# Patient Record
Sex: Female | Born: 1990 | Race: Black or African American | Hispanic: No | Marital: Single | Smoking: Former smoker
Health system: Southern US, Community
[De-identification: ages and names within clinical notes are randomized; demographics above are authoritative.]

## PROBLEM LIST (undated history)

## (undated) DIAGNOSIS — R519 Headache, unspecified: Secondary | ICD-10-CM

## (undated) HISTORY — DX: Headache, unspecified: R51.9

---

## 2015-02-13 ENCOUNTER — Emergency Department (HOSPITAL_COMMUNITY)
Admission: EM | Admit: 2015-02-13 | Discharge: 2015-02-14 | Disposition: A | Discharge status: Home / Self Care | Payer: PRIVATE HEALTH INSURANCE | Attending: Emergency Medicine | Admitting: Emergency Medicine

## 2015-02-13 ENCOUNTER — Encounter (HOSPITAL_COMMUNITY): Payer: Self-pay | Admitting: *Deleted

## 2015-02-13 DIAGNOSIS — R109 Unspecified abdominal pain: Secondary | ICD-10-CM | POA: Diagnosis present

## 2015-02-13 DIAGNOSIS — Z3202 Encounter for pregnancy test, result negative: Secondary | ICD-10-CM | POA: Diagnosis not present

## 2015-02-13 DIAGNOSIS — Z720 Tobacco use: Secondary | ICD-10-CM | POA: Insufficient documentation

## 2015-02-13 NOTE — ED Notes (Signed)
Pt reports bila flank  Pain today.  Pt also reports bila hands and feet swelling.

## 2015-02-14 ENCOUNTER — Emergency Department (HOSPITAL_COMMUNITY): Payer: PRIVATE HEALTH INSURANCE

## 2015-02-14 LAB — COMPREHENSIVE METABOLIC PANEL
ALK PHOS: 52 U/L (ref 38–126)
ALT: 11 U/L — AB (ref 14–54)
AST: 16 U/L (ref 15–41)
Albumin: 4.1 g/dL (ref 3.5–5.0)
Anion gap: 9 (ref 5–15)
BUN: 11 mg/dL (ref 6–20)
CALCIUM: 8.8 mg/dL — AB (ref 8.9–10.3)
CO2: 24 mmol/L (ref 22–32)
Chloride: 105 mmol/L (ref 101–111)
Creatinine, Ser: 0.71 mg/dL (ref 0.44–1.00)
GFR calc non Af Amer: >60 mL/min (ref >60)
Glucose, Bld: 96 mg/dL (ref 65–99)
POTASSIUM: 3.3 mmol/L — AB (ref 3.5–5.1)
SODIUM: 138 mmol/L (ref 135–145)
TOTAL PROTEIN: 7.5 g/dL (ref 6.5–8.1)
Total Bilirubin: 0.2 mg/dL — ABNORMAL LOW (ref 0.3–1.2)

## 2015-02-14 LAB — CBC WITH DIFFERENTIAL/PLATELET
Basophils Absolute: 0 10*3/uL (ref 0.0–0.1)
Basophils Relative: 0 % (ref 0–1)
EOS ABS: 0.3 10*3/uL (ref 0.0–0.7)
EOS PCT: 2 % (ref 0–5)
HCT: 36.6 % (ref 36.0–46.0)
Hemoglobin: 12.3 g/dL (ref 12.0–15.0)
LYMPHS ABS: 3.6 10*3/uL (ref 0.7–4.0)
LYMPHS PCT: 24 % (ref 12–46)
MCH: 29.5 pg (ref 26.0–34.0)
MCHC: 33.6 g/dL (ref 30.0–36.0)
MCV: 87.8 fL (ref 78.0–100.0)
MONOS PCT: 7 % (ref 3–12)
Monocytes Absolute: 1.1 10*3/uL — ABNORMAL HIGH (ref 0.1–1.0)
Neutro Abs: 9.8 10*3/uL — ABNORMAL HIGH (ref 1.7–7.7)
Neutrophils Relative %: 67 % (ref 43–77)
PLATELETS: 262 10*3/uL (ref 150–400)
RBC: 4.17 MIL/uL (ref 3.87–5.11)
RDW: 12.5 % (ref 11.5–15.5)
WBC: 14.8 10*3/uL — ABNORMAL HIGH (ref 4.0–10.5)

## 2015-02-14 LAB — PREGNANCY, URINE: Preg Test, Ur: NEGATIVE

## 2015-02-14 LAB — URINALYSIS, ROUTINE W REFLEX MICROSCOPIC
BILIRUBIN URINE: NEGATIVE
Glucose, UA: NEGATIVE mg/dL
HGB URINE DIPSTICK: NEGATIVE
Ketones, ur: NEGATIVE mg/dL
Leukocytes, UA: NEGATIVE
Nitrite: NEGATIVE
Protein, ur: NEGATIVE mg/dL
SPECIFIC GRAVITY, URINE: 1.008 (ref 1.005–1.030)
Urobilinogen, UA: 0.2 mg/dL (ref 0.0–1.0)
pH: 5.5 (ref 5.0–8.0)

## 2015-02-14 LAB — LIPASE, BLOOD: LIPASE: 18 U/L — AB (ref 22–51)

## 2015-02-14 NOTE — ED Provider Notes (Signed)
CSN: [id number]     Arrival date & time 02/13/15  2136 History   First MD Initiated Contact with Patient 02/14/15 0023     Chief Complaint  Patient presents with  . Flank Pain     (Consider location/radiation/quality/duration/timing/severity/associated sxs/prior Treatment) HPI  Ms. Vondra is a 24yo female with no significant past medical history presenting today with bilateral flank pain. She states she has had left-sided flank pain for many months off and on. Today it has progressed to bilateral flank pain. Her pain is sharp without radiation. She has had decreased urination. She denies history of kidney stones or urinary infections. She denies dysuria or hematuria. She's had no fevers or recent infections. She denies changes in her medications or medical history. Patient has no further complaints.  10 Systems reviewed and are negative for acute change except as noted in the HPI.   History reviewed. No pertinent past medical history. History reviewed. No pertinent past surgical history. No family history on file. History  Substance Use Topics  . Smoking status: Current Every Day Smoker    Types: Cigars  . Smokeless tobacco: Not on file  . Alcohol Use: Yes   OB History    No data available     Review of Systems    Allergies  Review of patient's allergies indicates no known allergies.  Home Medications   Prior to Admission medications   Medication Sig Start Date End Date Taking? Authorizing Provider  ibuprofen (ADVIL,MOTRIN) 200 MG tablet Take 400 mg by mouth every 6 (six) hours as needed for moderate pain.   Yes Historical Provider, MD   BP 125/77 mmHg  Pulse 106  Temp(Src) 98.6 F (37 C) (Oral)  Resp 20  SpO2 100%  LMP 01/16/2015 Physical Exam  Constitutional: She is oriented to person, place, and time. She appears well-developed and well-nourished. No distress.  HENT:  Head: Normocephalic and atraumatic.  Nose: Nose normal.  Mouth/Throat: Oropharynx is clear and  moist. No oropharyngeal exudate.  Eyes: Conjunctivae and EOM are normal. Pupils are equal, round, and reactive to light. No scleral icterus.  Neck: Normal range of motion. Neck supple. No JVD present. No tracheal deviation present. No thyromegaly present.  Cardiovascular: Normal rate, regular rhythm and normal heart sounds.  Exam reveals no gallop and no friction rub.   No murmur heard. Pulmonary/Chest: Effort normal and breath sounds normal. No respiratory distress. She has no wheezes. She exhibits no tenderness.  Abdominal: Soft. Bowel sounds are normal. She exhibits no distension and no mass. There is no tenderness. There is no rebound and no guarding.  Musculoskeletal: Normal range of motion. She exhibits no edema or tenderness.  Lymphadenopathy:    She has no cervical adenopathy.  Neurological: She is alert and oriented to person, place, and time. No cranial nerve deficit. She exhibits normal muscle tone.  Skin: Skin is warm and dry. No rash noted. She is not diaphoretic. No erythema. No pallor.  Nursing note and vitals reviewed.   ED Course  Procedures (including critical care time) Labs Review Labs Reviewed  CBC WITH DIFFERENTIAL/PLATELET - Abnormal; Notable for the following:    WBC 14.8 (*)    Neutro Abs 9.8 (*)    Monocytes Absolute 1.1 (*)    All other components within normal limits  COMPREHENSIVE METABOLIC PANEL - Abnormal; Notable for the following:    Potassium 3.3 (*)    Calcium 8.8 (*)    ALT 11 (*)    Total Bilirubin 0.2 (*)  All other components within normal limits  LIPASE, BLOOD - Abnormal; Notable for the following:    Lipase 18 (*)    All other components within normal limits  URINALYSIS, ROUTINE W REFLEX MICROSCOPIC (NOT AT [facility])  PREGNANCY, URINE    Imaging Review No results found.   EKG Interpretation None      MDM   Final diagnoses:  None    Patient presents to the emergency department with bilateral flank pain. States that urinalysis  are completely unremarkable. Patient is not tender anywhere on exam. It also was ordered to evaluate for possible hydronephrosis or nephrolithiasis.  Renal ultrasound reveals a normal study. I'm unsure what is causing this patient's flank pain. AAA was considered and is highly unlikely given the patient's history.  She is advised to see her primary care physician within 3 days for close follow-up.  Acetaminophen (normal limits, her blood pressure of 91/44 occurred while she was sleeping comfortably, patient is safe for discharge with close follow-up.    Tomasita CrumbleAdeleke Reyden Smith, MD 02/14/15 [phone]

## 2015-02-14 NOTE — Discharge Instructions (Signed)
Flank Pain Victoria Barron, your blood work, urine studies, and ultrasound of your kidneys were all normal today.See a primary care Physician within 3 days for close follow-up. If any symptoms worsen, to the emergency Department immediately. Thank you. Flank pain is pain in your side. The flank is the area of your side between your upper belly (abdomen) and your back. Pain in this area can be caused by many different things. HOME CARE Home care and treatment will depend on the cause of your pain.  Rest as told by your doctor.  Drink enough fluids to keep your pee (urine) clear or pale yellow.  Only take medicine as told by your doctor.  Tell your doctor about any changes in your pain.  Follow up with your doctor. GET HELP RIGHT AWAY IF:   Your pain does not get better with medicine.   You have new symptoms or your symptoms get worse.  Your pain gets worse.   You have belly (abdominal) pain.   You are short of breath.   You always feel sick to your stomach (nauseous).   You keep throwing up (vomiting).   You have puffiness (swelling) in your belly.   You feel light-headed or you pass out (faint).   You have blood in your pee.  You have a fever or lasting symptoms for more than 2-3 days.  You have a fever and your symptoms suddenly get worse. MAKE SURE YOU:   Understand these instructions.  Will watch your condition.  Will get help right away if you are not doing well or get worse. Document Released: 06/03/2008 Document Revised: 01/09/2014 Document Reviewed: 04/08/2012 [facility] Patient Information 2015 [place], [place]. This information is not intended to replace advice given to you by your health care provider. Make sure you discuss any questions you have with your health care provider.

## 2015-03-15 ENCOUNTER — Emergency Department (HOSPITAL_COMMUNITY)
Admission: EM | Admit: 2015-03-15 | Discharge: 2015-03-15 | Disposition: A | Discharge status: Home / Self Care | Payer: PRIVATE HEALTH INSURANCE | Attending: Emergency Medicine | Admitting: Emergency Medicine

## 2015-03-15 ENCOUNTER — Encounter (HOSPITAL_COMMUNITY): Payer: Self-pay

## 2015-03-15 ENCOUNTER — Emergency Department (HOSPITAL_COMMUNITY): Payer: PRIVATE HEALTH INSURANCE

## 2015-03-15 DIAGNOSIS — Y9389 Activity, other specified: Secondary | ICD-10-CM | POA: Insufficient documentation

## 2015-03-15 DIAGNOSIS — R0789 Other chest pain: Secondary | ICD-10-CM

## 2015-03-15 DIAGNOSIS — S199XXA Unspecified injury of neck, initial encounter: Secondary | ICD-10-CM | POA: Diagnosis not present

## 2015-03-15 DIAGNOSIS — Z720 Tobacco use: Secondary | ICD-10-CM | POA: Diagnosis not present

## 2015-03-15 DIAGNOSIS — Y998 Other external cause status: Secondary | ICD-10-CM | POA: Diagnosis not present

## 2015-03-15 DIAGNOSIS — S29001A Unspecified injury of muscle and tendon of front wall of thorax, initial encounter: Secondary | ICD-10-CM | POA: Insufficient documentation

## 2015-03-15 DIAGNOSIS — Y92410 Unspecified street and highway as the place of occurrence of the external cause: Secondary | ICD-10-CM | POA: Diagnosis not present

## 2015-03-15 MED ORDER — OXYCODONE-ACETAMINOPHEN 5-325 MG PO TABS: 1.0000 | ORAL_TABLET | Freq: Four times a day (QID) | ORAL | Status: DC | PRN

## 2015-03-15 MED ORDER — CYCLOBENZAPRINE HCL 10 MG PO TABS
10.0000 mg | ORAL_TABLET | Freq: Two times a day (BID) | ORAL | Status: DC | PRN
Start: 2015-03-15 — End: 2022-07-22

## 2015-03-15 MED ORDER — ACETAMINOPHEN 325 MG PO TABS
650.0000 mg | ORAL_TABLET | Freq: Once | ORAL | Status: AC
  Administered 2015-03-15: 650 mg via ORAL
  Filled 2015-03-15: qty 2

## 2015-03-15 NOTE — Discharge Instructions (Signed)
Chest Wall Pain °Chest wall pain is pain felt in or around the chest bones and muscles. It may take up to 6 weeks to get better. It may take longer if you are active. Chest wall pain can happen on its own. Other times, things like germs, injury, coughing, or exercise can cause the pain. °HOME CARE  °· Avoid activities that make you tired or cause pain. Try not to use your chest, belly (abdominal), or side muscles. Do not use heavy weights. °· Put ice on the sore area. °¨ Put ice in a plastic bag. °¨ Place a towel between your skin and the bag. °¨ Leave the ice on for 15-20 minutes for the first 2 days. °· Only take medicine as told by your doctor. °GET HELP RIGHT AWAY IF:  °· You have more pain or are very uncomfortable. °· You have a fever. °· Your chest pain gets worse. °· You have new problems. °· You feel sick to your stomach (nauseous) or throw up (vomit). °· You start to sweat or feel lightheaded. °· You have a cough with mucus (phlegm). °· You cough up blood. °MAKE SURE YOU:  °· Understand these instructions. °· Will watch your condition. °· Will get help right away if you are not doing well or get worse. °Document Released: 02/11/2008 Document Revised: 11/17/2011 Document Reviewed: 04/21/2011 °ExitCare® Patient Information ©2015 [employer]. This information is not intended to replace advice given to you by your health care provider. Make sure you discuss any questions you have with your health care provider. ° °
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
## 2015-03-15 NOTE — ED Notes (Signed)
Pt. Left with all belongings and refused wheelchair 

## 2015-10-24 IMAGING — US US RENAL
1 series · 14 of 25 positions shown · non-contrast
Comparison: None.

CLINICAL DATA: BILATERAL flank pain.

EXAM:
RENAL / URINARY TRACT ULTRASOUND COMPLETE

[Series 1: us renal · 0.22mm/px · 14 of 34 slices shown]
[im 1/34]
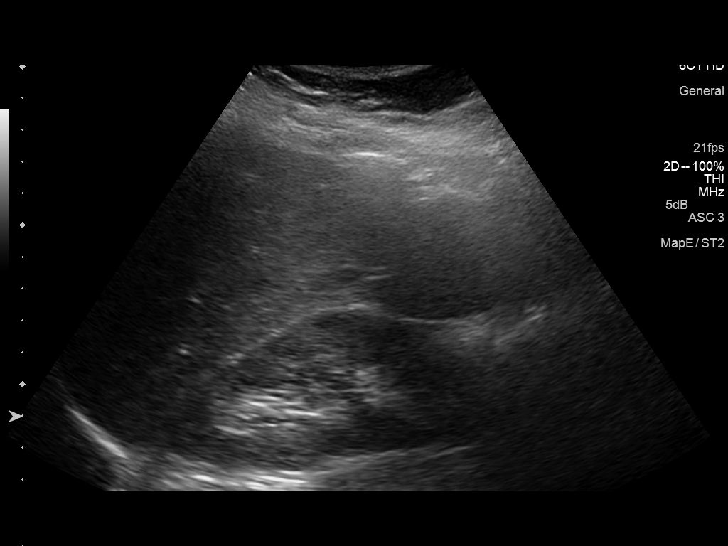
[im 3/34]
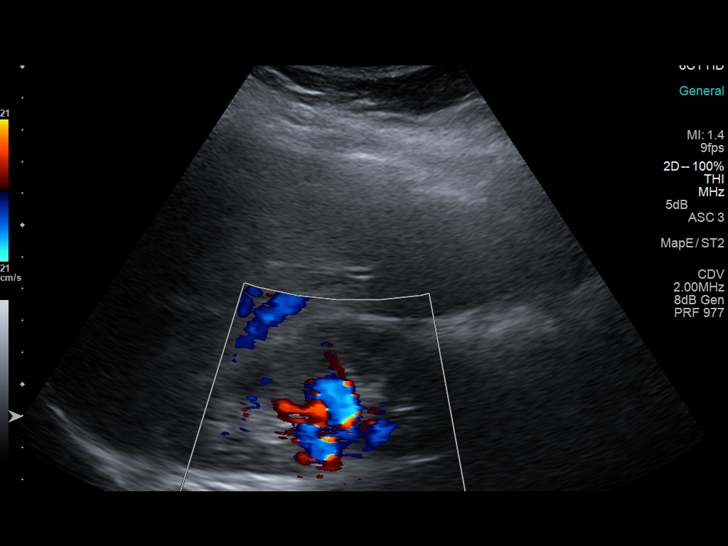
[im 6/34]
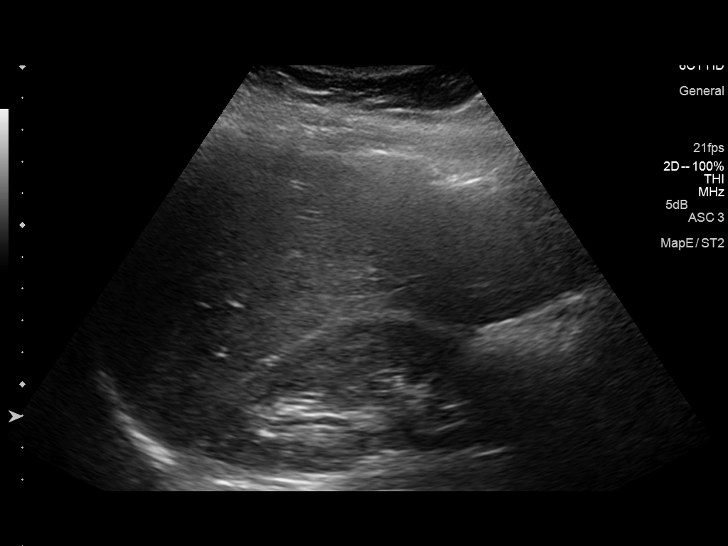
[im 9/34]
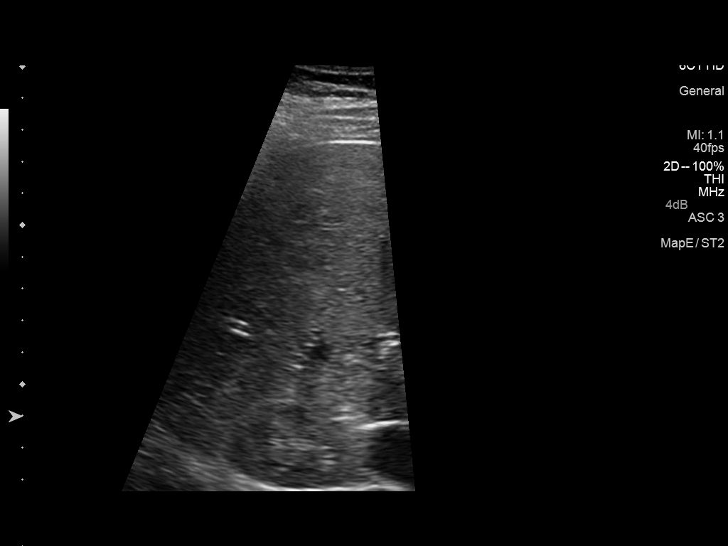
[im 12/34]
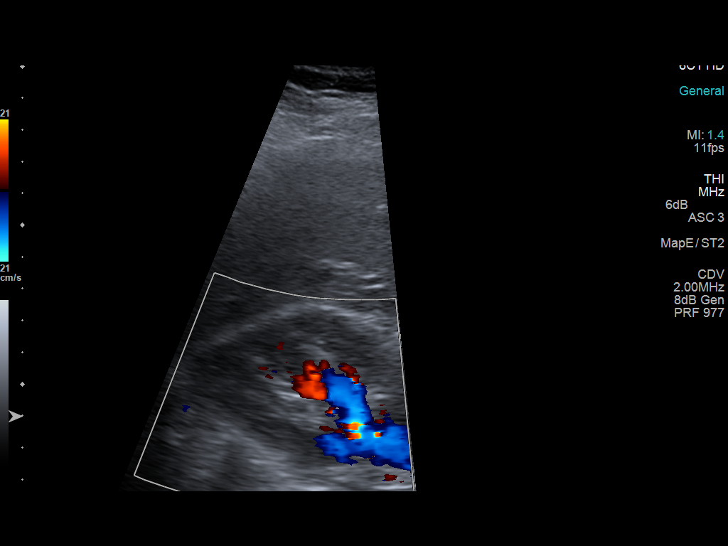
[im 13/34]
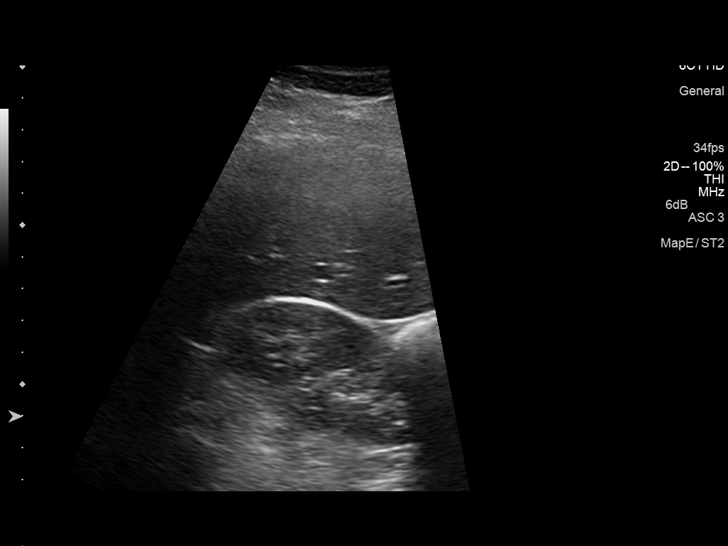
[im 16/34]
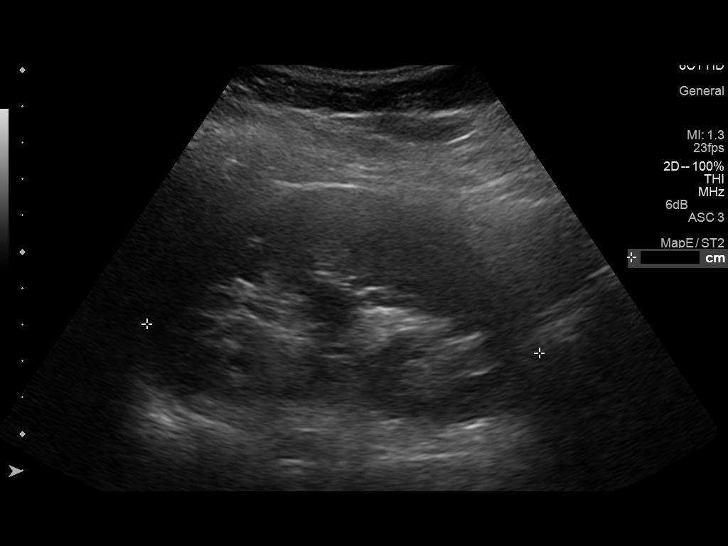
[im 18/34]
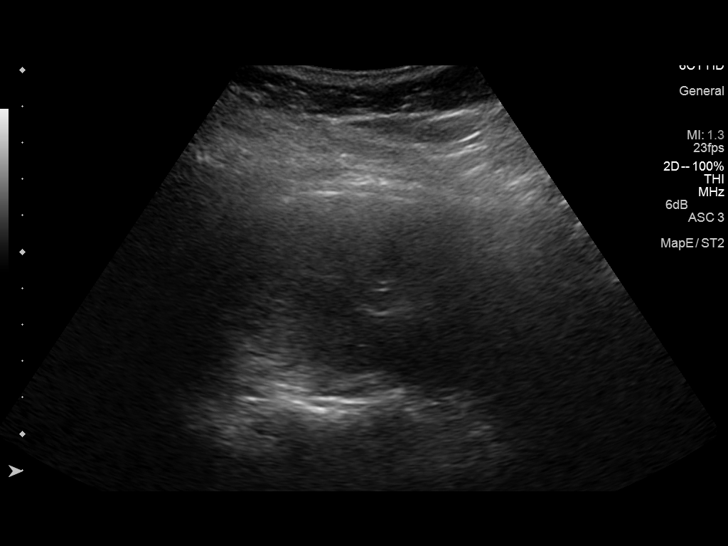
[im 21/34]
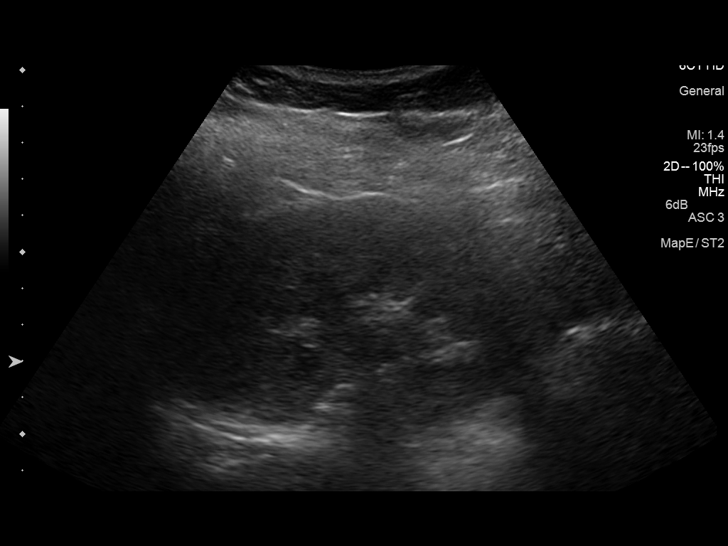
[im 23/34]
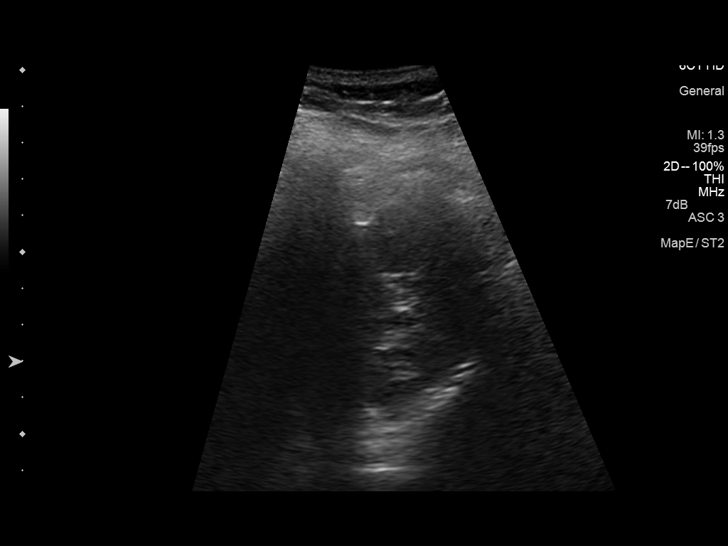
[im 25/34]
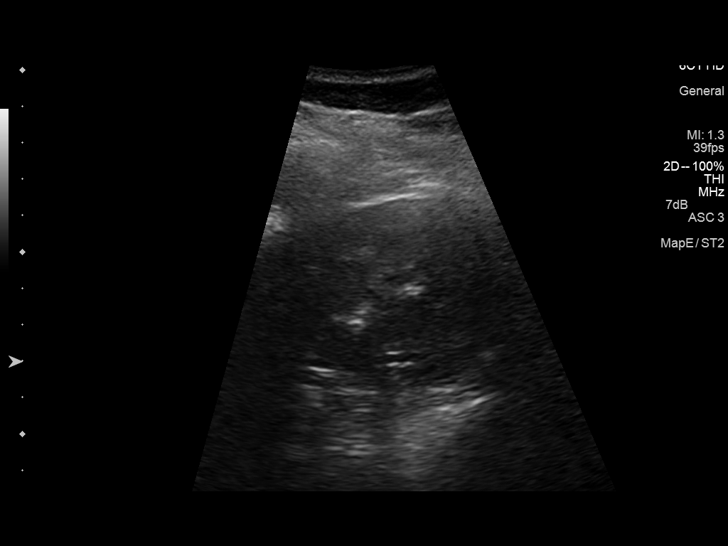
[im 28/34]
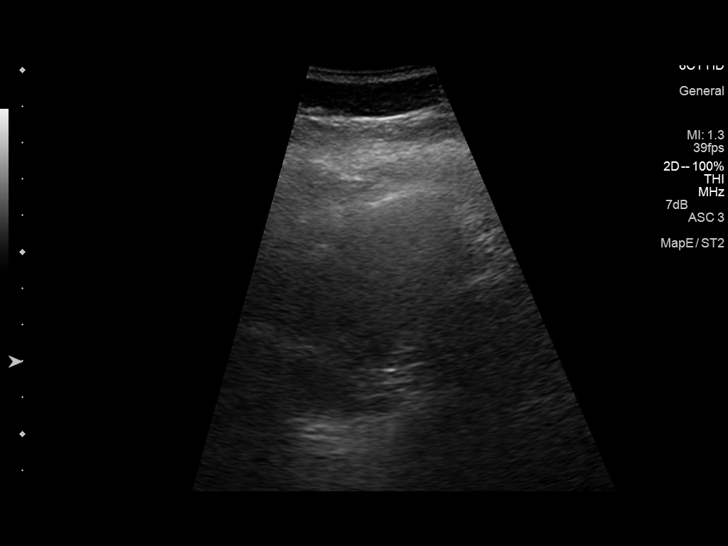
[im 31/34]
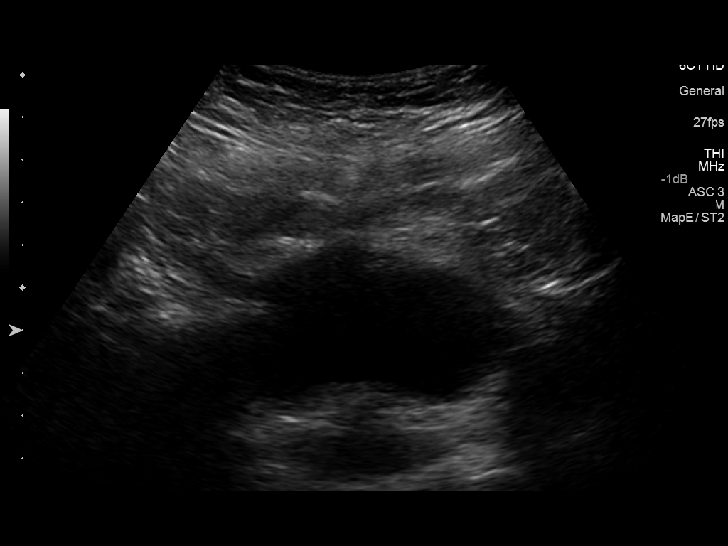
[im 34/34]
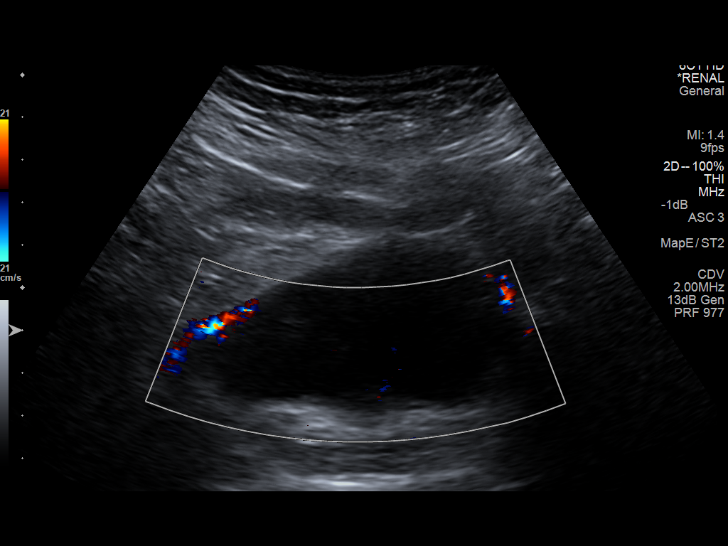

[14 of 25 positions shown; findings below may reference images not displayed]

FINDINGS: Right Kidney:

Length: 10.5 cm.. Echogenicity within normal limits. No mass or
hydronephrosis visualized.

Left Kidney:

Length: 10.8 cm.. Echogenicity within normal limits. No mass or
hydronephrosis visualized.

Bladder:

Appears normal for degree of bladder distention.
IMPRESSION: Negative exam.
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
## 2022-07-01 ENCOUNTER — Encounter: Payer: Self-pay | Admitting: *Deleted

## 2022-07-08 ENCOUNTER — Telehealth (INDEPENDENT_AMBULATORY_CARE_PROVIDER_SITE_OTHER): Payer: PRIVATE HEALTH INSURANCE

## 2022-07-08 DIAGNOSIS — Z3480 Encounter for supervision of other normal pregnancy, unspecified trimester: Secondary | ICD-10-CM | POA: Insufficient documentation

## 2022-07-08 DIAGNOSIS — Z3689 Encounter for other specified antenatal screening: Secondary | ICD-10-CM

## 2022-07-08 NOTE — Progress Notes (Signed)
New OB Intake  I connected withNAME@ on 07/08/22 at  2:15 PM EDT by MyChart Video Visit and verified that I am speaking with the correct person using two identifiers. Nurse is located at [facility] and pt is located at [employer].  I discussed the limitations, risks, security and privacy concerns of performing an evaluation and management service by telephone and the availability of in person appointments. I also discussed with the patient that there may be a patient responsible charge related to this service. The patient expressed understanding and agreed to proceed.  I explained I am completing New OB Intake today. We discussed EDD of 01/03/23 that is based on LMP of 03/29/22. Pt is G1/P0. I reviewed her allergies, medications, Medical/Surgical/OB history, and appropriate screenings. I informed her of [facility] services. [facility] information placed in AVS. Based on history, this is a low risk pregnancy.  There are no problems to display for this patient.   Concerns addressed today  Delivery Plans Plans to deliver at [facility]. Patient given information for [facility] Healthy Baby website for more information about Women's and [place]. Patient is not interested in water birth. Offered upcoming OB visit with CNM to discuss further.  MyChart/Babyscripts MyChart access verified. I explained pt will have some visits in office and some virtually. Babyscripts instructions given and order placed. Patient verifies receipt of registration text/e-mail. Account successfully created and app downloaded.  Blood Pressure Cuff/Weight Scale Patient has private insurance; instructed to purchase blood pressure cuff and bring to first prenatal appt. Explained after first prenatal appt pt will check weekly and document in 53. Patient does not have weight scale; patient may purchase if they desire to track weight weekly in Babyscripts.  Anatomy US Explained first scheduled [place] will be around 19 weeks. Anatomy US  scheduled for 08/15/22 at 0145. Pt notified to arrive at [place].  Labs Discussed Johnsie Cancel genetic screening with patient. Would like both Panorama and Horizon drawn at new OB visit. Routine prenatal labs needed.  COVID Vaccine Patient has had COVID vaccine.   Is patient a CenteringPregnancy candidate?  [facility] Declined due to Enrolled in [facility] Not a candidate due to  NA If accepted,    Is patient a Mom+Baby Combined Care candidate?  Accepted   If accepted, Mom+Baby staff notified  Social Determinants of Health Food Insecurity: Patient denies food insecurity. WIC Referral: Patient is interested in referral to [facility].  Transportation: Patient denies transportation needs. Childcare: Discussed no children allowed at ultrasound appointments. Offered childcare services; patient declines childcare services at this time.  First visit review I reviewed new OB appt with patient. I explained they will have a provider visit that includes . Explained pt will be seen by Dr. Dione Plover at first visit; encounter routed to appropriate provider. Explained that patient will be seen by pregnancy navigator following visit with provider.   Bethanne Ginger, [place] 07/08/2022  2:23 PM

## 2022-07-17 ENCOUNTER — Encounter: Payer: PRIVATE HEALTH INSURANCE | Admitting: Family Medicine

## 2022-07-17 ENCOUNTER — Encounter: Payer: Self-pay | Admitting: Student

## 2022-07-22 ENCOUNTER — Other Ambulatory Visit (HOSPITAL_COMMUNITY)
Admission: RE | Admit: 2022-07-22 | Discharge: 2022-07-22 | Disposition: A | Discharge status: Home / Self Care | Payer: 59 | Source: Ambulatory Visit | Attending: Student | Admitting: Student

## 2022-07-22 ENCOUNTER — Encounter: Payer: Self-pay | Admitting: Family Medicine

## 2022-07-22 ENCOUNTER — Ambulatory Visit (INDEPENDENT_AMBULATORY_CARE_PROVIDER_SITE_OTHER): Payer: 59 | Admitting: Family Medicine

## 2022-07-22 VITALS — BP 113/74 | HR 108 | Wt 184.4 lb

## 2022-07-22 DIAGNOSIS — K5900 Constipation, unspecified: Secondary | ICD-10-CM

## 2022-07-22 DIAGNOSIS — Z124 Encounter for screening for malignant neoplasm of cervix: Secondary | ICD-10-CM

## 2022-07-22 DIAGNOSIS — Z113 Encounter for screening for infections with a predominantly sexual mode of transmission: Secondary | ICD-10-CM | POA: Diagnosis not present

## 2022-07-22 DIAGNOSIS — Z3480 Encounter for supervision of other normal pregnancy, unspecified trimester: Secondary | ICD-10-CM

## 2022-07-22 MED ORDER — POLYETHYLENE GLYCOL 3350 17 GM/SCOOP PO POWD: 17.0000 g | Freq: Every day | ORAL | 1 refills | Status: DC | PRN

## 2022-07-22 NOTE — Patient Instructions (Signed)
Second Trimester of Pregnancy  The second trimester of pregnancy is from week 13 through week 27. This is months 4 through 6 of pregnancy. The second trimester is often a time when you feel your best. Your body has adjusted to being pregnant, and you begin to feel better physically. During the second trimester: Morning sickness has lessened or stopped completely. You may have more energy. You may have an increase in appetite. The second trimester is also a time when the unborn baby (fetus) is growing rapidly. At the end of the sixth month, the fetus may be up to 12 inches long and weigh about 1 pounds. You will likely begin to feel the baby move (quickening) between 16 and 20 weeks of pregnancy. Body changes during your second trimester Your body continues to go through many changes during your second trimester. The changes vary and generally return to normal after the baby is born. Physical changes Your weight will continue to increase. You will notice your lower abdomen bulging out. You may begin to get stretch marks on your hips, abdomen, and breasts. Your breasts will continue to grow and to become tender. Dark spots or blotches (chloasma or mask of pregnancy) may develop on your face. A dark line from your belly button to the pubic area (linea nigra) may appear. You may have changes in your hair. These can include thickening of your hair, rapid growth, and changes in texture. Some people also have hair loss during or after pregnancy, or hair that feels dry or thin. Health changes You may develop headaches. You may have heartburn. You may develop constipation. You may develop hemorrhoids or swollen, bulging veins (varicose veins). Your gums may bleed and may be sensitive to brushing and flossing. You may urinate more often because the fetus is pressing on your bladder. You may have back pain. This is caused by: Weight gain. Pregnancy hormones that are relaxing the joints in your  pelvis. A shift in weight and the muscles that support your balance. Follow these instructions at home: Medicines Follow your health care provider's instructions regarding medicine use. Specific medicines may be either safe or unsafe to take during pregnancy. Do not take any medicines unless approved by your health care provider. Take a prenatal vitamin that contains at least 600 micrograms (mcg) of folic acid. Eating and drinking Eat a healthy diet that includes fresh fruits and vegetables, whole grains, good sources of protein such as meat, eggs, or tofu, and low-fat dairy products. Avoid raw meat and unpasteurized juice, milk, and cheese. These carry germs that can harm you and your baby. You may need to take these actions to prevent or treat constipation: Drink enough fluid to keep your urine pale yellow. Eat foods that are high in fiber, such as beans, whole grains, and fresh fruits and vegetables. Limit foods that are high in fat and processed sugars, such as fried or sweet foods. Activity Exercise only as directed by your health care provider. Most people can continue their usual exercise routine during pregnancy. Try to exercise for 30 minutes at least 5 days a week. Stop exercising if you develop contractions in your uterus. Stop exercising if you develop pain or cramping in the lower abdomen or lower back. Avoid exercising if it is very hot or humid or if you are at a high altitude. Avoid heavy lifting. If you choose to, you may have sex unless your health care provider tells you not to. Relieving pain and discomfort Wear a supportive   bra to prevent discomfort from breast tenderness. Take warm sitz baths to soothe any pain or discomfort caused by hemorrhoids. Use hemorrhoid cream if your health care provider approves. Rest with your legs raised (elevated) if you have leg cramps or low back pain. If you develop varicose veins: Wear support hose as told by your health care  provider. Elevate your feet for 15 minutes, 3-4 times a day. Limit salt in your diet. Safety Wear your seat belt at all times when driving or riding in a car. Talk with your health care provider if someone is verbally or physically abusive to you. Lifestyle Do not use hot tubs, steam rooms, or saunas. Do not douche. Do not use tampons or scented sanitary pads. Avoid cat litter boxes and soil used by cats. These carry germs that can cause birth defects in the baby and possibly loss of the fetus by miscarriage or stillbirth. Do not use herbal remedies, alcohol, illegal drugs, or medicines that are not approved by your health care provider. Chemicals in these products can harm your baby. Do not use any products that contain nicotine or tobacco, such as cigarettes, e-cigarettes, and chewing tobacco. If you need help quitting, ask your health care provider. General instructions During a routine prenatal visit, your health care provider will do a physical exam and other tests. He or she will also discuss your overall health. Keep all follow-up visits. This is important. Ask your health care provider for a referral to a local prenatal education class. Ask for help if you have counseling or nutritional needs during pregnancy. Your health care provider can offer advice or refer you to specialists for help with various needs. Where to find more information American Pregnancy Association: americanpregnancy.org American College of Obstetricians and Gynecologists: acog.org/en/Womens%20Health/Pregnancy Office on Women's Health: womenshealth.gov/pregnancy Contact a health care provider if you have: A headache that does not go away when you take medicine. Vision changes or you see spots in front of your eyes. Mild pelvic cramps, pelvic pressure, or nagging pain in the abdominal area. Persistent nausea, vomiting, or diarrhea. A bad-smelling vaginal discharge or foul-smelling urine. Pain when you  urinate. Sudden or extreme swelling of your face, hands, ankles, feet, or legs. A fever. Get help right away if you: Have fluid leaking from your vagina. Have spotting or bleeding from your vagina. Have severe abdominal cramping or pain. Have difficulty breathing. Have chest pain. Have fainting spells. Have not felt your baby move for the time period told by your health care provider. Have new or increased pain, swelling, or redness in an arm or leg. Summary The second trimester of pregnancy is from week 13 through week 27 (months 4 through 6). Do not use herbal remedies, alcohol, illegal drugs, or medicines that are not approved by your health care provider. Chemicals in these products can harm your baby. Exercise only as directed by your health care provider. Most people can continue their usual exercise routine during pregnancy. Keep all follow-up visits. This is important. This information is not intended to replace advice given to you by your health care provider. Make sure you discuss any questions you have with your health care provider. Document Revised: 02/01/2020 Document Reviewed: 12/08/2019 Elsevier Patient Education  2023 Elsevier Inc.  Contraception Choices Contraception, also called birth control, refers to methods or devices that prevent pregnancy. Hormonal methods  Contraceptive implant A contraceptive implant is a thin, plastic tube that contains a hormone that prevents pregnancy. It is different from an intrauterine device (  IUD). It is inserted into the upper part of the arm by a health care provider. Implants can be effective for up to 3 years. Progestin-only injections Progestin-only injections are injections of progestin, a synthetic form of the hormone progesterone. They are given every 3 months by a health care provider. Birth control pills Birth control pills are pills that contain hormones that prevent pregnancy. They must be taken once a day, preferably at  the same time each day. A prescription is needed to use this method of contraception. Birth control patch The birth control patch contains hormones that prevent pregnancy. It is placed on the skin and must be changed once a week for three weeks and removed on the fourth week. A prescription is needed to use this method of contraception. Vaginal ring A vaginal ring contains hormones that prevent pregnancy. It is placed in the vagina for three weeks and removed on the fourth week. After that, the process is repeated with a new ring. A prescription is needed to use this method of contraception. Emergency contraceptive Emergency contraceptives prevent pregnancy after unprotected sex. They come in pill form and can be taken up to 5 days after sex. They work best the sooner they are taken after having sex. Most emergency contraceptives are available without a prescription. This method should not be used as your only form of birth control. Barrier methods  Female condom A female condom is a thin sheath that is worn over the penis during sex. Condoms keep sperm from going inside a woman's body. They can be used with a sperm-killing substance (spermicide) to increase their effectiveness. They should be thrown away after one use. Female condom A female condom is a soft, loose-fitting sheath that is put into the vagina before sex. The condom keeps sperm from going inside a woman's body. They should be thrown away after one use. Diaphragm A diaphragm is a soft, dome-shaped barrier. It is inserted into the vagina before sex, along with a spermicide. The diaphragm blocks sperm from entering the uterus, and the spermicide kills sperm. A diaphragm should be left in the vagina for 6-8 hours after sex and removed within 24 hours. A diaphragm is prescribed and fitted by a health care provider. A diaphragm should be replaced every 1-2 years, after giving birth, after gaining more than 15 lb (6.8 kg), and after pelvic  surgery. Cervical cap A cervical cap is a round, soft latex or plastic cup that fits over the cervix. It is inserted into the vagina before sex, along with spermicide. It blocks sperm from entering the uterus. The cap should be left in place for 6-8 hours after sex and removed within 48 hours. A cervical cap must be prescribed and fitted by a health care provider. It should be replaced every 2 years. Sponge A sponge is a soft, circular piece of polyurethane foam with spermicide in it. The sponge helps block sperm from entering the uterus, and the spermicide kills sperm. To use it, you make it wet and then insert it into the vagina. It should be inserted before sex, left in for at least 6 hours after sex, and removed and thrown away within 30 hours. Spermicides Spermicides are chemicals that kill or block sperm from entering the cervix and uterus. They can come as a cream, jelly, suppository, foam, or tablet. A spermicide should be inserted into the vagina with an applicator at least 10-15 minutes before sex to allow time for it to work. The process must be   repeated every time you have sex. Spermicides do not require a prescription. Intrauterine contraception Intrauterine device (IUD) An IUD is a T-shaped device that is put in a woman's uterus. There are two types: Hormone IUD.This type contains progestin, a synthetic form of the hormone progesterone. This type can stay in place for 3-5 years. Copper IUD.This type is wrapped in copper wire. It can stay in place for 10 years. Permanent methods of contraception Female tubal ligation In this method, a woman's fallopian tubes are sealed, tied, or blocked during surgery to prevent eggs from traveling to the uterus. Hysteroscopic sterilization In this method, a small, flexible insert is placed into each fallopian tube. The inserts cause scar tissue to form in the fallopian tubes and block them, so sperm cannot reach an egg. The procedure takes about 3  months to be effective. Another form of birth control must be used during those 3 months. Female sterilization This is a procedure to tie off the tubes that carry sperm (vasectomy). After the procedure, the man can still ejaculate fluid (semen). Another form of birth control must be used for 3 months after the procedure. Natural planning methods Natural family planning In this method, a couple does not have sex on days when the woman could become pregnant. Calendar method In this method, the woman keeps track of the length of each menstrual cycle, identifies the days when pregnancy can happen, and does not have sex on those days. Ovulation method In this method, a couple avoids sex during ovulation. Symptothermal method This method involves not having sex during ovulation. The woman typically checks for ovulation by watching changes in her temperature and in the consistency of cervical mucus. Post-ovulation method In this method, a couple waits to have sex until after ovulation. Where to find more information Centers for Disease Control and Prevention: www.cdc.gov Summary Contraception, also called birth control, refers to methods or devices that prevent pregnancy. Hormonal methods of contraception include implants, injections, pills, patches, vaginal rings, and emergency contraceptives. Barrier methods of contraception can include female condoms, female condoms, diaphragms, cervical caps, sponges, and spermicides. There are two types of IUDs (intrauterine devices). An IUD can be put in a woman's uterus to prevent pregnancy for 3-5 years. Permanent sterilization can be done through a procedure for males and females. Natural family planning methods involve nothaving sex on days when the woman could become pregnant. This information is not intended to replace advice given to you by your health care provider. Make sure you discuss any questions you have with your health care provider. Document  Revised: 01/30/2020 Document Reviewed: 01/30/2020 Elsevier Patient Education  2023 Elsevier Inc.  

## 2022-07-22 NOTE — Progress Notes (Signed)
      Subjective:   Victoria Barron is a 31 y.o. G1P0 at [redacted]w[redacted]d by regular and sure LMP being seen today for her first obstetrical visit.  Her obstetrical history is significant for n/a. Patient does intend to breast feed. Pregnancy history fully reviewed.  Patient reports no complaints.  HISTORY: OB History  Gravida Para Term Preterm AB Living  1 0 0 0 0 0  SAB IAB Ectopic Multiple Live Births  0 0 0 0 0    # Outcome Date GA Lbr Len/2nd Weight Sex Delivery Anes PTL Lv  1 Current              Last pap smear: No results found for: "DIAGPAP", "HPV", "HPVHIGH" *needs*  History reviewed. No pertinent past medical history. History reviewed. No pertinent surgical history. History reviewed. No pertinent family history. Social History   Tobacco Use   Smoking status: Every Day    Types: Cigars  Substance Use Topics   Alcohol use: Yes   Drug use: No   No Known Allergies Current Outpatient Medications on File Prior to Visit  Medication Sig Dispense Refill   Prenatal Vit-Fe Fumarate-FA (MULTIVITAMIN-PRENATAL) 27-0.8 MG TABS tablet Take 1 tablet by mouth daily at 12 noon.     No current facility-administered medications on file prior to visit.     Exam   Vitals:   07/22/22 1538  BP: 113/74  Pulse: (!) 108  Weight: 184 lb 6.4 oz (83.6 kg)   Fetal Heart Rate (bpm): 164  Uterus:     Pelvic Exam: Perineum: no hemorrhoids, normal perineum   Vulva: normal external genitalia, no lesions   Vagina:  normal mucosa mild amount of white discharge but denies any symptoms   Cervix: no lesions and normal, pap smear done.   System: General: well-developed, well-nourished female in no acute distress   Skin: normal coloration and turgor, no rashes   Neurologic: oriented, normal, negative, normal mood   Extremities: normal strength, tone, and muscle mass, ROM of all joints is normal   HEENT PERRLA, extraocular movement intact and sclera clear, anicteric   Neck supple and no masses    Respiratory:  no respiratory distress      Assessment:   Pregnancy: G1P0 Patient Active Problem List   Diagnosis Date Noted   Supervision of other normal pregnancy, antepartum 07/08/2022     Plan:  1. Supervision of other normal pregnancy, antepartum BP and FHR normal Pap collected Initial labs drawn. Continue prenatal vitamins. Genetic Screening discussed, NIPS: ordered. Ultrasound discussed; fetal anatomic survey: ordered. Problem list reviewed and updated. The nature of [facility] clinic was explained to patient; Voiced they may need to be seen by other [facility] providers which includes family medicine physicians, OB GYNs, and APPs. Delivery will hopefully be with one of the [facility] providers or another [facility] Medicine physician and we cannot promise this at this time.  Discussed there are [facility] staff in the hospital 24-7 and they understand and support this model and there is a likelihood one of these providers will catch their baby.  We also discussed that the service includes learners (residents, student) and they will be involved in the care team.  Routine obstetric precautions reviewed. Return in 4 weeks (on 08/19/2022) for [facility] patient, ob visit.

## 2022-07-22 NOTE — Addendum Note (Signed)
Addended by: Merian Capron on: 07/22/2022 04:32 PM   Modules accepted: Orders

## 2022-07-23 NOTE — Addendum Note (Signed)
Addended by: Maxwell Marion E on: 07/23/2022 11:47 AM   Modules accepted: Orders

## 2022-07-24 LAB — CBC/D/PLT+RPR+RH+ABO+RUBIGG...
Antibody Screen: NEGATIVE
Basophils Absolute: 0 10*3/uL (ref 0.0–0.2)
Basos: 0 %
EOS (ABSOLUTE): 0.1 10*3/uL (ref 0.0–0.4)
Eos: 1 %
HCV Ab: NONREACTIVE
HIV Screen 4th Generation wRfx: NONREACTIVE
Hematocrit: 33.8 % — ABNORMAL LOW (ref 34.0–46.6)
Hemoglobin: 11.8 g/dL (ref 11.1–15.9)
Hepatitis B Surface Ag: NEGATIVE
Immature Grans (Abs): 0 10*3/uL (ref 0.0–0.1)
Immature Granulocytes: 0 %
Lymphocytes Absolute: 2.8 10*3/uL (ref 0.7–3.1)
Lymphs: 27 %
MCH: 31.3 pg (ref 26.6–33.0)
MCHC: 34.9 g/dL (ref 31.5–35.7)
MCV: 90 fL (ref 79–97)
Monocytes Absolute: 0.6 10*3/uL (ref 0.1–0.9)
Monocytes: 6 %
Neutrophils Absolute: 6.9 10*3/uL (ref 1.4–7.0)
Neutrophils: 66 %
Platelets: 250 10*3/uL (ref 150–450)
RBC: 3.77 x10E6/uL (ref 3.77–5.28)
RDW: 12.5 % (ref 11.7–15.4)
RPR Ser Ql: NONREACTIVE
Rh Factor: POSITIVE
Rubella Antibodies, IGG: 13.9 index (ref >0.99)
WBC: 10.5 10*3/uL (ref 3.4–10.8)

## 2022-07-24 LAB — HCV INTERPRETATION

## 2022-07-24 LAB — URINE CULTURE, OB REFLEX: Organism ID, Bacteria: NO GROWTH

## 2022-07-24 LAB — AFP, SERUM, OPEN SPINA BIFIDA
AFP MoM: 1.25
AFP Value: 42.9 ng/mL
Gest. Age on Collection Date: 16.4 weeks
Maternal Age At EDD: 31.5 yr
OSBR Risk 1 IN: 10000
Test Results:: NEGATIVE
Weight: 184 [lb_av]

## 2022-07-24 LAB — HEMOGLOBIN A1C
Est. average glucose Bld gHb Est-mCnc: 100 mg/dL
Hgb A1c MFr Bld: 5.1 % (ref 4.8–5.6)

## 2022-07-24 LAB — CULTURE, OB URINE

## 2022-07-25 LAB — CYTOLOGY - PAP
Chlamydia: NEGATIVE
Comment: NEGATIVE
Comment: NEGATIVE
Comment: NEGATIVE
Comment: NORMAL
Diagnosis: NEGATIVE
High risk HPV: NEGATIVE
Neisseria Gonorrhea: NEGATIVE
Trichomonas: NEGATIVE

## 2022-07-29 LAB — PANORAMA PRENATAL TEST FULL PANEL:PANORAMA TEST PLUS 5 ADDITIONAL MICRODELETIONS: FETAL FRACTION: 10.6

## 2022-08-03 LAB — HORIZON CUSTOM: REPORT SUMMARY: NEGATIVE

## 2022-08-08 ENCOUNTER — Encounter: Payer: Self-pay | Admitting: Family Medicine

## 2022-08-15 ENCOUNTER — Ambulatory Visit: Payer: 59 | Admitting: *Deleted

## 2022-08-15 ENCOUNTER — Ambulatory Visit: Payer: 59 | Attending: Family Medicine

## 2022-08-15 ENCOUNTER — Other Ambulatory Visit: Payer: Self-pay | Admitting: *Deleted

## 2022-08-15 VITALS — BP 107/58 | HR 78

## 2022-08-15 DIAGNOSIS — Z3480 Encounter for supervision of other normal pregnancy, unspecified trimester: Secondary | ICD-10-CM | POA: Diagnosis not present

## 2022-08-15 DIAGNOSIS — Z3A19 19 weeks gestation of pregnancy: Secondary | ICD-10-CM | POA: Diagnosis not present

## 2022-08-15 DIAGNOSIS — E6609 Other obesity due to excess calories: Secondary | ICD-10-CM | POA: Diagnosis not present

## 2022-08-15 DIAGNOSIS — O99212 Obesity complicating pregnancy, second trimester: Secondary | ICD-10-CM | POA: Diagnosis not present

## 2022-08-15 DIAGNOSIS — Z363 Encounter for antenatal screening for malformations: Secondary | ICD-10-CM | POA: Insufficient documentation

## 2022-08-25 ENCOUNTER — Encounter: Payer: Self-pay | Admitting: *Deleted

## 2022-08-26 ENCOUNTER — Encounter: Payer: Self-pay | Admitting: Family Medicine

## 2022-08-26 ENCOUNTER — Ambulatory Visit (INDEPENDENT_AMBULATORY_CARE_PROVIDER_SITE_OTHER): Payer: 59 | Admitting: Family Medicine

## 2022-08-26 ENCOUNTER — Other Ambulatory Visit: Payer: Self-pay

## 2022-08-26 VITALS — BP 90/60 | HR 103 | Wt 193.0 lb

## 2022-08-26 DIAGNOSIS — K5900 Constipation, unspecified: Secondary | ICD-10-CM

## 2022-08-26 DIAGNOSIS — Z3480 Encounter for supervision of other normal pregnancy, unspecified trimester: Secondary | ICD-10-CM

## 2022-08-26 MED ORDER — POLYETHYLENE GLYCOL 3350 17 GM/SCOOP PO POWD: 17.0000 g | Freq: Every day | ORAL | 5 refills | Status: DC | PRN

## 2022-08-26 NOTE — Patient Instructions (Signed)

## 2022-08-26 NOTE — Progress Notes (Signed)
   Subjective:  Victoria Barron is a 31 y.o. G1P0 at [redacted]w[redacted]d being seen today for ongoing prenatal care.  She is currently monitored for the following issues for this low-risk pregnancy and has Supervision of other normal pregnancy, antepartum on their problem list.  Patient reports no complaints.  Contractions: Not present. Vag. Bleeding: None.  Movement: Present. Denies leaking of fluid.   The following portions of the patient's history were reviewed and updated as appropriate: allergies, current medications, past family history, past medical history, past social history, past surgical history and problem list. Problem list updated.  Objective:   Vitals:   08/26/22 1601  BP: 90/60  Pulse: (!) 103  Weight: 193 lb (87.5 kg)    Fetal Status: Fetal Heart Rate (bpm): 165 Fundal Height: 22 cm Movement: Present     General:  Alert, oriented and cooperative. Patient is in no acute distress.  Skin: Skin is warm and dry. No rash noted.   Cardiovascular: Normal heart rate noted  Respiratory: Normal respiratory effort, no problems with respiration noted  Abdomen: Soft, gravid, appropriate for gestational age. Pain/Pressure: Absent     Pelvic: Vag. Bleeding: None     Cervical exam deferred        Extremities: Normal range of motion.     Mental Status: Normal mood and affect. Normal behavior. Normal judgment and thought content.   Urinalysis:      Assessment and Plan:  Pregnancy: G1P0 at [redacted]w[redacted]d  1. Supervision of other normal pregnancy, antepartum BP and FHR normal Up to date on testing Still having constipation Discussed appropriate dosing of miralax  Preterm labor symptoms and general obstetric precautions including but not limited to vaginal bleeding, contractions, leaking of fluid and fetal movement were reviewed in detail with the patient. Please refer to After Visit Summary for other counseling recommendations.  Return in 4 weeks (on 09/23/2022) for [facility] patient, ob  visit.   Venora Maples, MD

## 2022-09-08 NOTE — L&D Delivery Note (Signed)
OB/GYN Faculty Practice Delivery Note  Victoria Barron is a 32 y.o. G1P0 s/p VD at [redacted]w[redacted]d. She was admitted for SOL.   ROM: 3h 101m with clear fluid GBS Status:  Negative/-- (04/08 1548) Maximum Maternal Temperature: 98.5  Labor Progress: Initial SVE: 5/90/-1. She then progressed to complete.   Delivery Date/Time: 12/25/2022  Delivery: Called to room and patient was complete and pushing. Head delivered ROA. Tight nuchal cord present x2. Shoulder and body delivered in usual fashion. Infant with spontaneous cry, placed on mother's abdomen, dried and stimulated. Cord clamped x 2 after 1-minute delay, and cut by FOB. Cord blood drawn. Placenta delivered spontaneously with gentle cord traction. Fundus firm with massage and Pitocin. Labia, perineum, vagina, and cervix inspected. She had a 2nd degree perineal laceration that was repaired with 2.0 vicryl. Figure of eight stitch placed for hemostasis.  Baby Weight: pending  Placenta: 3 vessel, intact. Sent to L&D Complications: Tight nuchal x2 Lacerations: 2nd degree laceration, 2.0 vicryl EBL: 185 mL Analgesia: Epidural   Infant:  APGAR (1 MIN): 9   APGAR (5 MINS): 9    Victoria Weatherholtz Autry-Lott, DO OB Fellow, Faculty [facility] 12/25/2022, 8:34 PM

## 2022-09-19 ENCOUNTER — Other Ambulatory Visit: Payer: Self-pay | Admitting: *Deleted

## 2022-09-19 ENCOUNTER — Ambulatory Visit: Payer: 59 | Admitting: *Deleted

## 2022-09-19 ENCOUNTER — Ambulatory Visit: Payer: 59 | Attending: Maternal & Fetal Medicine

## 2022-09-19 VITALS — BP 101/63 | HR 76

## 2022-09-19 DIAGNOSIS — Z362 Encounter for other antenatal screening follow-up: Secondary | ICD-10-CM | POA: Diagnosis not present

## 2022-09-19 DIAGNOSIS — Z3480 Encounter for supervision of other normal pregnancy, unspecified trimester: Secondary | ICD-10-CM

## 2022-09-19 DIAGNOSIS — Z3A24 24 weeks gestation of pregnancy: Secondary | ICD-10-CM

## 2022-09-19 DIAGNOSIS — O99212 Obesity complicating pregnancy, second trimester: Secondary | ICD-10-CM

## 2022-09-19 DIAGNOSIS — E669 Obesity, unspecified: Secondary | ICD-10-CM

## 2022-09-22 ENCOUNTER — Other Ambulatory Visit: Payer: Self-pay

## 2022-09-22 ENCOUNTER — Ambulatory Visit (INDEPENDENT_AMBULATORY_CARE_PROVIDER_SITE_OTHER): Payer: 59 | Admitting: Family Medicine

## 2022-09-22 VITALS — BP 106/72 | HR 101 | Wt 196.7 lb

## 2022-09-22 DIAGNOSIS — Z3480 Encounter for supervision of other normal pregnancy, unspecified trimester: Secondary | ICD-10-CM

## 2022-09-22 NOTE — Patient Instructions (Signed)

## 2022-09-22 NOTE — Progress Notes (Signed)
   PRENATAL VISIT NOTE  Subjective:  Victoria Barron is a 32 y.o. G1P0 at [redacted]w[redacted]d being seen today for ongoing prenatal care.  She is currently monitored for the following issues for this low-risk pregnancy and has Supervision of other normal pregnancy, antepartum on their problem list.  Patient reports no complaints.  Contractions: Not present. Vag. Bleeding: None.  Movement: Present. Denies leaking of fluid.   The following portions of the patient's history were reviewed and updated as appropriate: allergies, current medications, past family history, past medical history, past social history, past surgical history and problem list.   Objective:   Vitals:   09/22/22 1329  BP: 106/72  Pulse: (!) 101  Weight: 196 lb 11.2 oz (89.2 kg)    Fetal Status: Fetal Heart Rate (bpm): 164   Movement: Present     General:  Alert, oriented and cooperative. Patient is in no acute distress.  Skin: Skin is warm and dry. No rash noted.   Cardiovascular: Normal heart rate noted  Respiratory: Normal respiratory effort, no problems with respiration noted  Abdomen: Soft, gravid, appropriate for gestational age.  Pain/Pressure: Absent     Pelvic: Cervical exam deferred        Extremities: Normal range of motion.     Mental Status: Normal mood and affect. Normal behavior. Normal judgment and thought content.   Assessment and Plan:  Pregnancy: G1P0 at [redacted]w[redacted]d  1. 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 Has a question about soft spot to the right of umbilicus-- not a hernia and appears normal - Glucose Tolerance, 2 Hours w/1 Hour; Future - RPR; Future - HIV Antibody (routine testing w rflx); Future - CBC; Future   Preterm labor symptoms and general obstetric precautions including but not limited to vaginal bleeding, contractions, leaking of fluid and fetal movement were reviewed in detail with the patient. Please  refer to After Visit Summary for other counseling recommendations.   Return in about 4 weeks (around 10/20/2022) for [facility]Baby Combined Care with 28 week labs.  Future Appointments  Date Time Provider [place]  10/20/2022  3:30 PM [facility] NURSE [facility]  10/20/2022  3:45 PM [facility] US4 [facility]  11/14/2022  2:30 PM [facility] NURSE [facility]  11/14/2022  2:45 PM [facility] US5 [facility] [place]    Caren Macadam, MD

## 2022-10-15 ENCOUNTER — Other Ambulatory Visit: Payer: Self-pay

## 2022-10-15 ENCOUNTER — Other Ambulatory Visit: Payer: 59

## 2022-10-15 DIAGNOSIS — Z3480 Encounter for supervision of other normal pregnancy, unspecified trimester: Secondary | ICD-10-CM

## 2022-10-16 LAB — HIV ANTIBODY (ROUTINE TESTING W REFLEX): HIV Screen 4th Generation wRfx: NONREACTIVE

## 2022-10-16 LAB — CBC
Hematocrit: 33.1 % — ABNORMAL LOW (ref 34.0–46.6)
Hemoglobin: 11.4 g/dL (ref 11.1–15.9)
MCH: 30.4 pg (ref 26.6–33.0)
MCHC: 34.4 g/dL (ref 31.5–35.7)
MCV: 88 fL (ref 79–97)
Platelets: 241 10*3/uL (ref 150–450)
RBC: 3.75 x10E6/uL — ABNORMAL LOW (ref 3.77–5.28)
RDW: 11.8 % (ref 11.7–15.4)
WBC: 13.1 10*3/uL — ABNORMAL HIGH (ref 3.4–10.8)

## 2022-10-16 LAB — GLUCOSE TOLERANCE, 2 HOURS W/ 1HR
Glucose, 1 hour: 132 mg/dL (ref 70–179)
Glucose, 2 hour: 99 mg/dL (ref 70–152)
Glucose, Fasting: 84 mg/dL (ref 70–91)

## 2022-10-16 LAB — RPR: RPR Ser Ql: NONREACTIVE

## 2022-10-20 ENCOUNTER — Encounter: Payer: Self-pay | Admitting: *Deleted

## 2022-10-20 ENCOUNTER — Ambulatory Visit: Payer: 59 | Attending: Maternal & Fetal Medicine

## 2022-10-20 ENCOUNTER — Ambulatory Visit: Payer: 59 | Admitting: *Deleted

## 2022-10-20 VITALS — BP 105/59 | HR 81

## 2022-10-20 DIAGNOSIS — Z3A29 29 weeks gestation of pregnancy: Secondary | ICD-10-CM | POA: Diagnosis not present

## 2022-10-20 DIAGNOSIS — E669 Obesity, unspecified: Secondary | ICD-10-CM | POA: Diagnosis not present

## 2022-10-20 DIAGNOSIS — O99212 Obesity complicating pregnancy, second trimester: Secondary | ICD-10-CM | POA: Diagnosis present

## 2022-10-20 DIAGNOSIS — Z3480 Encounter for supervision of other normal pregnancy, unspecified trimester: Secondary | ICD-10-CM

## 2022-10-20 DIAGNOSIS — O99213 Obesity complicating pregnancy, third trimester: Secondary | ICD-10-CM | POA: Insufficient documentation

## 2022-10-21 ENCOUNTER — Encounter: Payer: 59 | Admitting: Family Medicine

## 2022-10-21 ENCOUNTER — Other Ambulatory Visit: Payer: 59

## 2022-10-24 ENCOUNTER — Encounter: Payer: 59 | Admitting: Family Medicine

## 2022-10-24 ENCOUNTER — Other Ambulatory Visit: Payer: 59

## 2022-11-03 ENCOUNTER — Ambulatory Visit (INDEPENDENT_AMBULATORY_CARE_PROVIDER_SITE_OTHER): Payer: 59 | Admitting: Family Medicine

## 2022-11-03 ENCOUNTER — Encounter: Payer: Self-pay | Admitting: Family Medicine

## 2022-11-03 ENCOUNTER — Other Ambulatory Visit: Payer: Self-pay

## 2022-11-03 VITALS — BP 100/68 | HR 90 | Wt 204.0 lb

## 2022-11-03 DIAGNOSIS — Z3480 Encounter for supervision of other normal pregnancy, unspecified trimester: Secondary | ICD-10-CM | POA: Diagnosis not present

## 2022-11-03 DIAGNOSIS — Z23 Encounter for immunization: Secondary | ICD-10-CM | POA: Diagnosis not present

## 2022-11-03 NOTE — Progress Notes (Signed)
   PRENATAL VISIT NOTE  Subjective:  Victoria Barron is a 32 y.o. G1P0 at 69w2dbeing seen today for ongoing prenatal care.  She is currently monitored for the following issues for this low-risk pregnancy and has Supervision of other normal pregnancy, antepartum on their problem list.  Patient reports no complaints.  Contractions: Irritability. Vag. Bleeding: None.  Movement: Present. Denies leaking of fluid.   The following portions of the patient's history were reviewed and updated as appropriate: allergies, current medications, past family history, past medical history, past social history, past surgical history and problem list.   Objective:   Vitals:   11/03/22 1330  BP: 100/68  Pulse: 90  Weight: 204 lb (92.5 kg)    Fetal Status: Fetal Heart Rate (bpm): 148   Movement: Present     General:  Alert, oriented and cooperative. Patient is in no acute distress.  Skin: Skin is warm and dry. No rash noted.   Cardiovascular: Normal heart rate noted  Respiratory: Normal respiratory effort, no problems with respiration noted  Abdomen: Soft, gravid, appropriate for gestational age.  Pain/Pressure: Present     Pelvic: Cervical exam deferred        Extremities: Normal range of motion.  Edema: None  Mental Status: Normal mood and affect. Normal behavior. Normal judgment and thought content.   Assessment and Plan:  Pregnancy: G1P0 at 339w2d. Supervision of other normal pregnancy, antepartum Reports back pain, likely related to later pregnancy. Discussed comfort measures Vigorous movement - Tdap vaccine greater than or equal to 7yo IM  Preterm labor symptoms and general obstetric precautions including but not limited to vaginal bleeding, contractions, leaking of fluid and fetal movement were reviewed in detail with the patient. Please refer to After Visit Summary for other counseling recommendations.   Return in about 2 weeks (around 11/17/2022) for Mom+Baby Combined Care.  Future  Appointments  Date Time Provider [place]3/04/2023  2:30 PM [facility]3/04/2023  2:45 PM [facility] US5 [facility]3/08/2023  3:35 PM EcClarnce FlockMD [facility]3/26/2024  3:35 PM EcClarnce FlockMD [facility]4/04/2023  2:35 PM NeCaren MacadamMD [facility]4/16/2024  2:35 PM EcClarnce FlockMD WML[facility]4/23/2024  2:35 PM PrDonnamae JudeMD WMOak[facility]4/29/2024  3:15 PM NeCaren MacadamMD WMRed [facility] SystemM[facility]  KiCaren MacadamMD

## 2022-11-14 ENCOUNTER — Ambulatory Visit: Payer: 59

## 2022-11-18 ENCOUNTER — Ambulatory Visit (INDEPENDENT_AMBULATORY_CARE_PROVIDER_SITE_OTHER): Payer: 59 | Admitting: Family Medicine

## 2022-11-18 ENCOUNTER — Ambulatory Visit (HOSPITAL_BASED_OUTPATIENT_CLINIC_OR_DEPARTMENT_OTHER): Payer: 59

## 2022-11-18 ENCOUNTER — Other Ambulatory Visit: Payer: Self-pay | Admitting: *Deleted

## 2022-11-18 ENCOUNTER — Ambulatory Visit: Payer: 59 | Attending: Maternal & Fetal Medicine | Admitting: *Deleted

## 2022-11-18 ENCOUNTER — Encounter: Payer: Self-pay | Admitting: *Deleted

## 2022-11-18 ENCOUNTER — Encounter: Payer: Self-pay | Admitting: Family Medicine

## 2022-11-18 VITALS — BP 103/70 | HR 98 | Wt 205.9 lb

## 2022-11-18 VITALS — BP 108/56 | HR 78

## 2022-11-18 DIAGNOSIS — O99213 Obesity complicating pregnancy, third trimester: Secondary | ICD-10-CM | POA: Insufficient documentation

## 2022-11-18 DIAGNOSIS — E669 Obesity, unspecified: Secondary | ICD-10-CM

## 2022-11-18 DIAGNOSIS — Z3480 Encounter for supervision of other normal pregnancy, unspecified trimester: Secondary | ICD-10-CM

## 2022-11-18 DIAGNOSIS — Z3A33 33 weeks gestation of pregnancy: Secondary | ICD-10-CM | POA: Insufficient documentation

## 2022-11-18 DIAGNOSIS — O3663X0 Maternal care for excessive fetal growth, third trimester, not applicable or unspecified: Secondary | ICD-10-CM | POA: Insufficient documentation

## 2022-11-18 DIAGNOSIS — O99212 Obesity complicating pregnancy, second trimester: Secondary | ICD-10-CM

## 2022-11-18 NOTE — Progress Notes (Signed)
   Subjective:  Victoria Barron is a 32 y.o. G1P0 at [redacted]w[redacted]d being seen today for ongoing prenatal care.  She is currently monitored for the following issues for this low-risk pregnancy and has Supervision of other normal pregnancy, antepartum on their problem list.  Patient reports no complaints.  Contractions: Not present. Vag. Bleeding: None.  Movement: Present. Denies leaking of fluid.   The following portions of the patient's history were reviewed and updated as appropriate: allergies, current medications, past family history, past medical history, past social history, past surgical history and problem list. Problem list updated.  Objective:   Vitals:   11/18/22 1559  BP: 103/70  Pulse: 98  Weight: 205 lb 14.4 oz (93.4 kg)    Fetal Status: Fetal Heart Rate (bpm): 160   Movement: Present     General:  Alert, oriented and cooperative. Patient is in no acute distress.  Skin: Skin is warm and dry. No rash noted.   Cardiovascular: Normal heart rate noted  Respiratory: Normal respiratory effort, no problems with respiration noted  Abdomen: Soft, gravid, appropriate for gestational age. Pain/Pressure: Present     Pelvic: Vag. Bleeding: None     Cervical exam deferred        Extremities: Normal range of motion.     Mental Status: Normal mood and affect. Normal behavior. Normal judgment and thought content.   Urinalysis:      Assessment and Plan:  Pregnancy: G1P0 at [redacted]w[redacted]d  1. Supervision of other normal pregnancy, antepartum BP and FHR normal Growth [place] today showed EFW 94% and AC>99%, has follow up scheduled per MFM  Preterm labor symptoms and general obstetric precautions including but not limited to vaginal bleeding, contractions, leaking of fluid and fetal movement were reviewed in detail with the patient. Please refer to After Visit Summary for other counseling recommendations.  Return in 2 weeks (on 12/02/2022) for Dyad patient, ob visit.   Clarnce Flock, MD

## 2022-11-18 NOTE — Patient Instructions (Signed)

## 2022-11-30 ENCOUNTER — Ambulatory Visit
Admission: EM | Admit: 2022-11-30 | Discharge: 2022-11-30 | Disposition: A | Discharge status: Home / Self Care | Payer: 59 | Attending: Family Medicine | Admitting: Family Medicine

## 2022-11-30 DIAGNOSIS — Z3A35 35 weeks gestation of pregnancy: Secondary | ICD-10-CM | POA: Diagnosis not present

## 2022-11-30 DIAGNOSIS — J111 Influenza due to unidentified influenza virus with other respiratory manifestations: Secondary | ICD-10-CM

## 2022-11-30 DIAGNOSIS — J069 Acute upper respiratory infection, unspecified: Secondary | ICD-10-CM | POA: Diagnosis present

## 2022-11-30 DIAGNOSIS — O99513 Diseases of the respiratory system complicating pregnancy, third trimester: Secondary | ICD-10-CM | POA: Insufficient documentation

## 2022-11-30 DIAGNOSIS — Z1152 Encounter for screening for COVID-19: Secondary | ICD-10-CM | POA: Diagnosis not present

## 2022-11-30 LAB — POCT INFLUENZA A/B
Influenza A, POC: NEGATIVE
Influenza B, POC: NEGATIVE

## 2022-11-30 MED ORDER — OSELTAMIVIR PHOSPHATE 75 MG PO CAPS: 75.0000 mg | ORAL_CAPSULE | Freq: Two times a day (BID) | ORAL | 0 refills | Status: DC

## 2022-11-30 NOTE — ED Provider Notes (Signed)
[facility]    CSN: [id number] Arrival date & time: 11/30/22  1139      History   Chief Complaint Chief Complaint  Patient presents with   Sore Throat   Generalized Body Aches   Headache   Fever    HPI Victoria Barron is a 32 y.o. female.    Sore Throat Associated symptoms include headaches.  Headache Associated symptoms: fever   Fever Associated symptoms: headaches    Here for sore throat cough, and congestion.  She is also had myalgia and fever and chills and headache.  Symptoms began on March 22.  No nausea or vomiting or diarrhea.  She has been exposed to the flu; her boyfriend has had it and tested positive on Friday.  She is [redacted] weeks pregnant.  No ear pain  Past Medical History:  Diagnosis Date   Headache     Patient Active Problem List   Diagnosis Date Noted   Supervision of other normal pregnancy, antepartum 07/08/2022    History reviewed. No pertinent surgical history.  OB History     Gravida  1   Para      Term      Preterm      AB      Living         SAB      IAB      Ectopic      Multiple      Live Births               Home Medications    Prior to Admission medications   Medication Sig Start Date End Date Taking? Authorizing Provider  oseltamivir (TAMIFLU) 75 MG capsule Take 1 capsule (75 mg total) by mouth every 12 (twelve) hours. 11/30/22  Yes Barrett Henle, MD  polyethylene glycol powder (GLYCOLAX/MIRALAX) 17 GM/SCOOP powder Take 17 g by mouth daily as needed. 08/26/22   Clarnce Flock, MD  Prenatal Vit-Fe Fumarate-FA (MULTIVITAMIN-PRENATAL) 27-0.8 MG TABS tablet Take 1 tablet by mouth daily at 12 noon.    [provider]    Family History Family History  Problem Relation Age of Onset   Asthma Mother    Diabetes Sister    Asthma Brother    Cancer Maternal Uncle    Diabetes Paternal Grandmother    Diabetes Paternal Grandfather    Heart disease Neg Hx    Hypertension Neg Hx     Stroke Neg Hx     Social History Social History   Tobacco Use   Smoking status: Former    Types: Cigars    Quit date: 05/03/2022    Years since quitting: 0.5  Vaping Use   Vaping Use: Never used  Substance Use Topics   Alcohol use: Not Currently   Drug use: Yes    Types: Marijuana    Comment: last use Aug 2023     Allergies   Patient has no known allergies.   Review of Systems Review of Systems  Constitutional:  Positive for fever.  Neurological:  Positive for headaches.     Physical Exam Triage Vital Signs ED Triage Vitals [11/30/22 1147]  Enc Vitals Group     BP 112/75     Pulse Rate (!) 133     Resp 18     Temp 98.7 F (37.1 C)     Temp Source Oral     SpO2 96 %     Weight      Height  Head Circumference      Peak Flow      Pain Score 5     Pain Loc      Pain Edu?      Excl. in [place]?    No data found.  Updated Vital Signs BP 112/75 (BP Location: Right Arm)   Pulse (!) 133   Temp 98.7 F (37.1 C) (Oral)   Resp 18   LMP 03/29/2022   SpO2 96%   Visual Acuity Right Eye Distance:   Left Eye Distance:   Bilateral Distance:    Right Eye Near:   Left Eye Near:    Bilateral Near:     Physical Exam Vitals reviewed.  Constitutional:      General: She is not in acute distress.    Appearance: She is not toxic-appearing.  HENT:     Nose: Nose normal.     Mouth/Throat:     Mouth: Mucous membranes are moist.     Pharynx: No oropharyngeal exudate or posterior oropharyngeal erythema.  Eyes:     Extraocular Movements: Extraocular movements intact.     Conjunctiva/sclera: Conjunctivae normal.     Pupils: Pupils are equal, round, and reactive to light.  Cardiovascular:     Rate and Rhythm: Normal rate and regular rhythm.     Heart sounds: No murmur heard. Pulmonary:     Effort: Pulmonary effort is normal. No respiratory distress.     Breath sounds: No stridor. No wheezing, rhonchi or rales.  Musculoskeletal:     Cervical back: Neck supple.   Lymphadenopathy:     Cervical: No cervical adenopathy.  Skin:    Capillary Refill: Capillary refill takes less than 2 seconds.     Coloration: Skin is not jaundiced or pale.  Neurological:     General: No focal deficit present.     Mental Status: She is alert and oriented to person, place, and time.  Psychiatric:        Behavior: Behavior normal.      UC Treatments / Results  Labs (all labs ordered are listed, but only abnormal results are displayed) Labs Reviewed  POCT INFLUENZA A/B    EKG   Radiology No results found.  Procedures Procedures (including critical care time)  Medications Ordered in UC Medications - No data to display  Initial Impression / Assessment and Plan / UC Course  I have reviewed the triage vital signs and the nursing notes.  Pertinent labs & imaging results that were available during my care of the patient were reviewed by me and considered in my medical decision making (see chart for details).        Point-of-care flu test is negative.  Since it has such poor negative predictive value, I am still treating her empirically for influenza-like illness, since untreated influenza can cause complications in pregnancy.  COVID swab is done, and if positive she would be a candidate for Paxlovid; her last creatinine and EGFR were normal. Final Clinical Impressions(s) / UC Diagnoses   Final diagnoses:  Viral URI  Influenza-like illness     Discharge Instructions      The flu test was negative  Since there is such a big chance that this rapid test was incorrect and you were exposed to the flu, I have sent in oseltamivir.  Take oseltamivir 75 mg--1 capsule 2 times daily for 5 days; this is an antiviral to treat possible flu, and to reduce the chances of you having complications with your pregnancy due  to the flu illness   You have been swabbed for COVID, and the test will result in the next 24 hours. Our staff will call you if positive. If  the COVID test is positive, you should quarantine until you are fever free for 24 hours and you are starting to feel better, and then take added precautions for the next 5 days, such as physical distancing/wearing a mask and good hand hygiene/washing.  If the COVID test is positive, then stop taking the Tamiflu.     ED Prescriptions     Medication Sig Dispense Auth. Provider   oseltamivir (TAMIFLU) 75 MG capsule Take 1 capsule (75 mg total) by mouth every 12 (twelve) hours. 10 capsule Barrett Henle, MD      PDMP not reviewed this encounter.   Barrett Henle, MD 11/30/22 1230

## 2022-11-30 NOTE — ED Triage Notes (Signed)
Patient exposed to flu and she began having symptoms yesterday (sore throat, congestion, headache, fever, and body aches).

## 2022-11-30 NOTE — Discharge Instructions (Signed)
The flu test was negative  Since there is such a big chance that this rapid test was incorrect and you were exposed to the flu, I have sent in oseltamivir.  Take oseltamivir 75 mg--1 capsule 2 times daily for 5 days; this is an antiviral to treat possible flu, and to reduce the chances of you having complications with your pregnancy due to the flu illness   You have been swabbed for COVID, and the test will result in the next 24 hours. Our staff will call you if positive. If the COVID test is positive, you should quarantine until you are fever free for 24 hours and you are starting to feel better, and then take added precautions for the next 5 days, such as physical distancing/wearing a mask and good hand hygiene/washing.  If the COVID test is positive, then stop taking the Tamiflu.

## 2022-12-01 LAB — SARS CORONAVIRUS 2 (TAT 6-24 HRS): SARS Coronavirus 2: NEGATIVE

## 2022-12-02 ENCOUNTER — Ambulatory Visit (INDEPENDENT_AMBULATORY_CARE_PROVIDER_SITE_OTHER): Payer: 59 | Admitting: Family Medicine

## 2022-12-02 ENCOUNTER — Other Ambulatory Visit: Payer: Self-pay

## 2022-12-02 ENCOUNTER — Encounter: Payer: Self-pay | Admitting: Family Medicine

## 2022-12-02 VITALS — BP 94/65 | HR 93 | Wt 205.6 lb

## 2022-12-02 DIAGNOSIS — O3660X0 Maternal care for excessive fetal growth, unspecified trimester, not applicable or unspecified: Secondary | ICD-10-CM | POA: Insufficient documentation

## 2022-12-02 DIAGNOSIS — O3663X0 Maternal care for excessive fetal growth, third trimester, not applicable or unspecified: Secondary | ICD-10-CM

## 2022-12-02 DIAGNOSIS — Z3A35 35 weeks gestation of pregnancy: Secondary | ICD-10-CM

## 2022-12-02 DIAGNOSIS — Z3480 Encounter for supervision of other normal pregnancy, unspecified trimester: Secondary | ICD-10-CM

## 2022-12-02 NOTE — Progress Notes (Signed)
   Subjective:  Victoria Barron is a 32 y.o. G1P0 at [redacted]w[redacted]d being seen today for ongoing prenatal care.  She is currently monitored for the following issues for this low-risk pregnancy and has Supervision of other normal pregnancy, antepartum and LGA (large for gestational age) fetus affecting management of mother on their problem list.  Patient reports no complaints.  Contractions: Not present. Vag. Bleeding: None.  Movement: Present. Denies leaking of fluid.   The following portions of the patient's history were reviewed and updated as appropriate: allergies, current medications, past family history, past medical history, past social history, past surgical history and problem list. Problem list updated.  Objective:   Vitals:   12/02/22 1605  BP: 94/65  Pulse: 93  Weight: 205 lb 9.6 oz (93.3 kg)    Fetal Status: Fetal Heart Rate (bpm): 142   Movement: Present     General:  Alert, oriented and cooperative. Patient is in no acute distress.  Skin: Skin is warm and dry. No rash noted.   Cardiovascular: Normal heart rate noted  Respiratory: Normal respiratory effort, no problems with respiration noted  Abdomen: Soft, gravid, appropriate for gestational age. Pain/Pressure: Present     Pelvic: Vag. Bleeding: None     Cervical exam deferred        Extremities: Normal range of motion.     Mental Status: Normal mood and affect. Normal behavior. Normal judgment and thought content.   Urinalysis:      Assessment and Plan:  Pregnancy: G1P0 at [redacted]w[redacted]d  1. Supervision of other normal pregnancy, antepartum BP and FHR normal Swabs next visit Feeling a little dizzy, hasn't had much to eat in a while, encouraged to increase PO intake, go to MAU if symptoms are persistent  2. Excessive fetal growth affecting management of pregnancy in third trimester, single or unspecified fetus Growth [place] 11/18/2022 EFW 94%, 2694g Has follow up scheduled on 12/16/2022  Preterm labor symptoms and general obstetric  precautions including but not limited to vaginal bleeding, contractions, leaking of fluid and fetal movement were reviewed in detail with the patient. Please refer to After Visit Summary for other counseling recommendations.  Return in 1 week (on 12/09/2022) for Dyad patient, ob visit.   Clarnce Flock, MD

## 2022-12-02 NOTE — Patient Instructions (Signed)

## 2022-12-15 ENCOUNTER — Ambulatory Visit (INDEPENDENT_AMBULATORY_CARE_PROVIDER_SITE_OTHER): Payer: 59 | Admitting: Family Medicine

## 2022-12-15 ENCOUNTER — Other Ambulatory Visit (HOSPITAL_COMMUNITY)
Admission: RE | Admit: 2022-12-15 | Discharge: 2022-12-15 | Disposition: A | Discharge status: Home / Self Care | Payer: 59 | Source: Ambulatory Visit | Attending: Family Medicine | Admitting: Family Medicine

## 2022-12-15 ENCOUNTER — Encounter: Payer: Self-pay | Admitting: Family Medicine

## 2022-12-15 ENCOUNTER — Other Ambulatory Visit: Payer: Self-pay

## 2022-12-15 VITALS — BP 104/69 | HR 74 | Wt 209.6 lb

## 2022-12-15 DIAGNOSIS — Z3480 Encounter for supervision of other normal pregnancy, unspecified trimester: Secondary | ICD-10-CM | POA: Insufficient documentation

## 2022-12-15 DIAGNOSIS — O3663X0 Maternal care for excessive fetal growth, third trimester, not applicable or unspecified: Secondary | ICD-10-CM

## 2022-12-15 DIAGNOSIS — Z3A37 37 weeks gestation of pregnancy: Secondary | ICD-10-CM

## 2022-12-15 NOTE — Progress Notes (Signed)
   PRENATAL VISIT NOTE  Subjective:  Victoria Barron is a 32 y.o. G1P0 at [redacted]w[redacted]d being seen today for ongoing prenatal care.  She is currently monitored for the following issues for this low-risk pregnancy and has Supervision of other normal pregnancy, antepartum and LGA (large for gestational age) fetus affecting management of mother on their problem list.  Patient reports no complaints.  Contractions: Not present. Vag. Bleeding: None.  Movement: Present. Denies leaking of fluid.   The following portions of the patient's history were reviewed and updated as appropriate: allergies, current medications, past family history, past medical history, past social history, past surgical history and problem list.   Objective:   Vitals:   12/15/22 1437  BP: 104/69  Pulse: 74  Weight: 209 lb 9.6 oz (95.1 kg)    Fetal Status: Fetal Heart Rate (bpm): 155 Fundal Height: 37 cm Movement: Present  Presentation: Vertex  General:  Alert, oriented and cooperative. Patient is in no acute distress.  Skin: Skin is warm and dry. No rash noted.   Cardiovascular: Normal heart rate noted  Respiratory: Normal respiratory effort, no problems with respiration noted  Abdomen: Soft, gravid, appropriate for gestational age.  Pain/Pressure: Present     Pelvic: Cervical exam deferred        Extremities: Normal range of motion.     Mental Status: Normal mood and affect. Normal behavior. Normal judgment and thought content.   Assessment and Plan:  Pregnancy: G1P0 at [redacted]w[redacted]d 1. Supervision of other normal pregnancy, antepartum Up to date  Doing well otherwise.  36 wk swabs FH fairly normal.  No contractions and does not want cervical exam check.   2. Excessive fetal growth affecting management of pregnancy in third trimester, single or unspecified fetus 2hr GTT was very normal 3/14-- EFW 94th% (2694g), AC > 99th% and BPD 99th%  Preterm labor symptoms and general obstetric precautions including but not limited to  vaginal bleeding, contractions, leaking of fluid and fetal movement were reviewed in detail with the patient. Please refer to After Visit Summary for other counseling recommendations.   Return in about 1 week (around 12/22/2022) for Mom+Baby Combined Care.  Future Appointments  Date Time Provider Department Center  12/16/2022  3:15 PM [facility] NURSE [facility]  12/16/2022  3:30 PM W[facility] US2 [facility]US [facility]  12/23/2022  2:35 PM Venora Maples, MD [facility]  12/30/2022  2:35 PM Reva Bores, MD [facility]  01/05/2023  2:15 PM W[facility]-WOCA NST [facility]  01/05/2023  3:15 PM Federico Flake, MD [facility]    Federico Flake, MD

## 2022-12-16 ENCOUNTER — Ambulatory Visit: Payer: 59 | Attending: Maternal & Fetal Medicine

## 2022-12-16 ENCOUNTER — Ambulatory Visit: Payer: 59 | Admitting: *Deleted

## 2022-12-16 ENCOUNTER — Encounter: Payer: Self-pay | Admitting: *Deleted

## 2022-12-16 VITALS — BP 106/52 | HR 72

## 2022-12-16 DIAGNOSIS — E669 Obesity, unspecified: Secondary | ICD-10-CM | POA: Diagnosis not present

## 2022-12-16 DIAGNOSIS — O3663X0 Maternal care for excessive fetal growth, third trimester, not applicable or unspecified: Secondary | ICD-10-CM | POA: Diagnosis present

## 2022-12-16 DIAGNOSIS — Z3480 Encounter for supervision of other normal pregnancy, unspecified trimester: Secondary | ICD-10-CM

## 2022-12-16 DIAGNOSIS — O99213 Obesity complicating pregnancy, third trimester: Secondary | ICD-10-CM | POA: Insufficient documentation

## 2022-12-16 DIAGNOSIS — Z3A37 37 weeks gestation of pregnancy: Secondary | ICD-10-CM | POA: Insufficient documentation

## 2022-12-16 LAB — GC/CHLAMYDIA PROBE AMP (~~LOC~~) NOT AT ARMC
Chlamydia: NEGATIVE
Comment: NEGATIVE
Comment: NORMAL
Neisseria Gonorrhea: NEGATIVE

## 2022-12-17 ENCOUNTER — Encounter (HOSPITAL_COMMUNITY): Payer: Self-pay | Admitting: Obstetrics and Gynecology

## 2022-12-17 ENCOUNTER — Inpatient Hospital Stay (HOSPITAL_COMMUNITY)
Admission: AD | Admit: 2022-12-17 | Discharge: 2022-12-17 | Disposition: A | Discharge status: Home / Self Care | Payer: 59 | Attending: Obstetrics and Gynecology | Admitting: Obstetrics and Gynecology

## 2022-12-17 ENCOUNTER — Telehealth: Payer: Self-pay

## 2022-12-17 DIAGNOSIS — M549 Dorsalgia, unspecified: Secondary | ICD-10-CM | POA: Diagnosis not present

## 2022-12-17 DIAGNOSIS — K5900 Constipation, unspecified: Secondary | ICD-10-CM | POA: Diagnosis not present

## 2022-12-17 DIAGNOSIS — Z3A37 37 weeks gestation of pregnancy: Secondary | ICD-10-CM

## 2022-12-17 DIAGNOSIS — O9A213 Injury, poisoning and certain other consequences of external causes complicating pregnancy, third trimester: Secondary | ICD-10-CM

## 2022-12-17 DIAGNOSIS — Z87891 Personal history of nicotine dependence: Secondary | ICD-10-CM | POA: Insufficient documentation

## 2022-12-17 DIAGNOSIS — O99613 Diseases of the digestive system complicating pregnancy, third trimester: Secondary | ICD-10-CM | POA: Diagnosis not present

## 2022-12-17 DIAGNOSIS — O26893 Other specified pregnancy related conditions, third trimester: Secondary | ICD-10-CM

## 2022-12-17 MED ORDER — CYCLOBENZAPRINE HCL 5 MG PO TABS: 5.0000 mg | ORAL_TABLET | Freq: Three times a day (TID) | ORAL | 0 refills | Status: DC | PRN
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
## 2022-12-19 LAB — CULTURE, BETA STREP (GROUP B ONLY): Strep Gp B Culture: NEGATIVE

## 2022-12-23 ENCOUNTER — Encounter: Payer: Self-pay | Admitting: Family Medicine

## 2022-12-23 ENCOUNTER — Ambulatory Visit (INDEPENDENT_AMBULATORY_CARE_PROVIDER_SITE_OTHER): Payer: 59 | Admitting: Family Medicine

## 2022-12-23 ENCOUNTER — Other Ambulatory Visit: Payer: Self-pay

## 2022-12-23 VITALS — BP 108/69 | HR 92 | Wt 206.6 lb

## 2022-12-23 DIAGNOSIS — Z3480 Encounter for supervision of other normal pregnancy, unspecified trimester: Secondary | ICD-10-CM

## 2022-12-23 NOTE — Progress Notes (Signed)
   Subjective:  Victoria Barron is a 32 y.o. G1P0 at [redacted]w[redacted]d being seen today for ongoing prenatal care.  She is currently monitored for the following issues for this low-risk pregnancy and has Supervision of other normal pregnancy, antepartum and LGA (large for gestational age) fetus affecting management of mother on their problem list.  Patient reports no complaints.  Contractions: Irregular. Vag. Bleeding: Scant.  Movement: Present. Denies leaking of fluid.   The following portions of the patient's history were reviewed and updated as appropriate: allergies, current medications, past family history, past medical history, past social history, past surgical history and problem list. Problem list updated.  Objective:   Vitals:   12/23/22 1447  BP: 108/69  Pulse: 92  Weight: 206 lb 9.6 oz (93.7 kg)    Fetal Status: Fetal Heart Rate (bpm): 160   Movement: Present     General:  Alert, oriented and cooperative. Patient is in no acute distress.  Skin: Skin is warm and dry. No rash noted.   Cardiovascular: Normal heart rate noted  Respiratory: Normal respiratory effort, no problems with respiration noted  Abdomen: Soft, gravid, appropriate for gestational age. Pain/Pressure: Present     Pelvic: Vag. Bleeding: Scant     Cervical exam deferred        Extremities: Normal range of motion.     Mental Status: Normal mood and affect. Normal behavior. Normal judgment and thought content.   Urinalysis:      Assessment and Plan:  Pregnancy: G1P0 at [redacted]w[redacted]d  1. Supervision of other normal pregnancy, antepartum BP and FHR normal Discussed labor precautions  Term labor symptoms and general obstetric precautions including but not limited to vaginal bleeding, contractions, leaking of fluid and fetal movement were reviewed in detail with the patient. Please refer to After Visit Summary for other counseling recommendations.  Return in about 1 week (around 12/30/2022) for Dyad patient, ob  visit.   Venora Maples, MD

## 2022-12-24 ENCOUNTER — Encounter (HOSPITAL_COMMUNITY): Payer: Self-pay | Admitting: Obstetrics and Gynecology

## 2022-12-24 ENCOUNTER — Inpatient Hospital Stay (HOSPITAL_COMMUNITY)
Admission: AD | Admit: 2022-12-24 | Discharge: 2022-12-25 | Disposition: A | Discharge status: Home / Self Care | Payer: 59 | Source: Home / Self Care | Attending: Obstetrics and Gynecology | Admitting: Obstetrics and Gynecology

## 2022-12-24 DIAGNOSIS — Z3480 Encounter for supervision of other normal pregnancy, unspecified trimester: Secondary | ICD-10-CM

## 2022-12-24 DIAGNOSIS — O471 False labor at or after 37 completed weeks of gestation: Secondary | ICD-10-CM | POA: Insufficient documentation

## 2022-12-24 DIAGNOSIS — Z3A38 38 weeks gestation of pregnancy: Secondary | ICD-10-CM | POA: Insufficient documentation

## 2022-12-24 DIAGNOSIS — O479 False labor, unspecified: Secondary | ICD-10-CM

## 2022-12-24 MED ORDER — ACETAMINOPHEN 500 MG PO TABS
1000.0000 mg | ORAL_TABLET | Freq: Once | ORAL | Status: AC
  Administered 2022-12-24: 1000 mg via ORAL
  Filled 2022-12-24: qty 2

## 2022-12-24 NOTE — MAU Note (Signed)
.  Victoria Barron is a 32 y.o. at [redacted]w[redacted]d here in MAU reporting:   Contractions every: 5-7 minutes Onset of ctx: Yesterday Pain score: 10/10  ROM: Intact Vaginal Bleeding: None Last SVE: UKN  Epidural: Not planning  Fetal Movement: Reports positive FM FHT:150 via External  Vitals:   12/24/22 2246  BP: 112/67  Pulse: 86  Resp: 18  Temp: 98 F (36.7 C)  SpO2: 100%       OB Office: Faculty GBS: Negative HSV: Denies hx of HSV Lab orders placed from triage: MAU Labor Eval

## 2022-12-25 ENCOUNTER — Other Ambulatory Visit: Payer: Self-pay

## 2022-12-25 ENCOUNTER — Inpatient Hospital Stay (HOSPITAL_COMMUNITY): Payer: 59 | Admitting: Anesthesiology

## 2022-12-25 ENCOUNTER — Inpatient Hospital Stay (HOSPITAL_COMMUNITY)
Admission: AD | Admit: 2022-12-25 | Discharge: 2022-12-27 | DRG: 807 | Disposition: A | Discharge status: Home / Self Care | Payer: 59 | Attending: Obstetrics and Gynecology | Admitting: Obstetrics and Gynecology

## 2022-12-25 ENCOUNTER — Encounter (HOSPITAL_COMMUNITY): Payer: Self-pay | Admitting: Family Medicine

## 2022-12-25 DIAGNOSIS — I959 Hypotension, unspecified: Secondary | ICD-10-CM | POA: Diagnosis not present

## 2022-12-25 DIAGNOSIS — Z87891 Personal history of nicotine dependence: Secondary | ICD-10-CM

## 2022-12-25 DIAGNOSIS — O9902 Anemia complicating childbirth: Secondary | ICD-10-CM | POA: Diagnosis present

## 2022-12-25 DIAGNOSIS — O99892 Other specified diseases and conditions complicating childbirth: Secondary | ICD-10-CM | POA: Diagnosis not present

## 2022-12-25 DIAGNOSIS — O3663X0 Maternal care for excessive fetal growth, third trimester, not applicable or unspecified: Principal | ICD-10-CM | POA: Diagnosis present

## 2022-12-25 DIAGNOSIS — Z3A38 38 weeks gestation of pregnancy: Secondary | ICD-10-CM | POA: Diagnosis not present

## 2022-12-25 DIAGNOSIS — O3660X0 Maternal care for excessive fetal growth, unspecified trimester, not applicable or unspecified: Secondary | ICD-10-CM | POA: Diagnosis present

## 2022-12-25 DIAGNOSIS — O26893 Other specified pregnancy related conditions, third trimester: Secondary | ICD-10-CM | POA: Diagnosis present

## 2022-12-25 DIAGNOSIS — Z3A37 37 weeks gestation of pregnancy: Secondary | ICD-10-CM | POA: Diagnosis not present

## 2022-12-25 DIAGNOSIS — Z3480 Encounter for supervision of other normal pregnancy, unspecified trimester: Secondary | ICD-10-CM

## 2022-12-25 LAB — TYPE AND SCREEN
ABO/RH(D): A POS
Antibody Screen: NEGATIVE

## 2022-12-25 LAB — CBC
HCT: 38.6 % (ref 36.0–46.0)
Hemoglobin: 12.4 g/dL (ref 12.0–15.0)
MCH: 27.4 pg (ref 26.0–34.0)
MCHC: 32.1 g/dL (ref 30.0–36.0)
MCV: 85.2 fL (ref 80.0–100.0)
Platelets: 290 10*3/uL (ref 150–400)
RBC: 4.53 MIL/uL (ref 3.87–5.11)
RDW: 14.5 % (ref 11.5–15.5)
WBC: 18.8 10*3/uL — ABNORMAL HIGH (ref 4.0–10.5)
nRBC: 0 % (ref 0.0–0.2)

## 2022-12-25 MED ORDER — OXYTOCIN BOLUS FROM INFUSION
333.0000 mL | Freq: Once | INTRAVENOUS | Status: AC
  Administered 2022-12-25: 333 mL via INTRAVENOUS

## 2022-12-25 MED ORDER — OXYCODONE-ACETAMINOPHEN 5-325 MG PO TABS: 1.0000 | ORAL_TABLET | ORAL | Status: DC | PRN

## 2022-12-25 MED ORDER — WITCH HAZEL-GLYCERIN EX PADS: 1.0000 | MEDICATED_PAD | CUTANEOUS | Status: DC | PRN

## 2022-12-25 MED ORDER — PHENYLEPHRINE 80 MCG/ML (10ML) SYRINGE FOR IV PUSH (FOR BLOOD PRESSURE SUPPORT): 80.0000 ug | PREFILLED_SYRINGE | INTRAVENOUS | Status: DC | PRN

## 2022-12-25 MED ORDER — MORPHINE SULFATE (PF) 4 MG/ML IV SOLN: 2.0000 mg | Freq: Once | INTRAVENOUS | Status: DC

## 2022-12-25 MED ORDER — OXYTOCIN-SODIUM CHLORIDE 30-0.9 UT/500ML-% IV SOLN
2.5000 [IU]/h | INTRAVENOUS | Status: DC
  Filled 2022-12-25: qty 500

## 2022-12-25 MED ORDER — SIMETHICONE 80 MG PO CHEW: 80.0000 mg | CHEWABLE_TABLET | ORAL | Status: DC | PRN

## 2022-12-25 MED ORDER — FLEET ENEMA 7-19 GM/118ML RE ENEM: 1.0000 | ENEMA | RECTAL | Status: DC | PRN

## 2022-12-25 MED ORDER — LIDOCAINE HCL (PF) 1 % IJ SOLN
INTRAMUSCULAR | Status: DC | PRN
  Administered 2022-12-25: 5 mL via EPIDURAL
  Administered 2022-12-25: 3 mL via EPIDURAL

## 2022-12-25 MED ORDER — TERBUTALINE SULFATE 1 MG/ML IJ SOLN: 0.2500 mg | Freq: Once | INTRAMUSCULAR | Status: DC | PRN

## 2022-12-25 MED ORDER — ACETAMINOPHEN 325 MG PO TABS: 650.0000 mg | ORAL_TABLET | ORAL | Status: DC | PRN

## 2022-12-25 MED ORDER — IBUPROFEN 600 MG PO TABS
600.0000 mg | ORAL_TABLET | Freq: Four times a day (QID) | ORAL | Status: DC
  Administered 2022-12-25 – 2022-12-27 (×7): 600 mg via ORAL
  Filled 2022-12-25 (×7): qty 1

## 2022-12-25 MED ORDER — COCONUT OIL OIL: 1.0000 | TOPICAL_OIL | Status: DC | PRN

## 2022-12-25 MED ORDER — ZOLPIDEM TARTRATE 5 MG PO TABS: 5.0000 mg | ORAL_TABLET | Freq: Every evening | ORAL | Status: DC | PRN

## 2022-12-25 MED ORDER — FENTANYL CITRATE (PF) 100 MCG/2ML IJ SOLN
50.0000 ug | INTRAMUSCULAR | Status: DC | PRN
  Administered 2022-12-25: 100 ug via INTRAVENOUS
  Filled 2022-12-25: qty 2

## 2022-12-25 MED ORDER — DIPHENHYDRAMINE HCL 25 MG PO CAPS: 25.0000 mg | ORAL_CAPSULE | Freq: Four times a day (QID) | ORAL | Status: DC | PRN

## 2022-12-25 MED ORDER — SENNOSIDES-DOCUSATE SODIUM 8.6-50 MG PO TABS
2.0000 | ORAL_TABLET | ORAL | Status: DC
  Administered 2022-12-26 – 2022-12-27 (×2): 2 via ORAL
  Filled 2022-12-25 (×2): qty 2

## 2022-12-25 MED ORDER — ACETAMINOPHEN 325 MG PO TABS
650.0000 mg | ORAL_TABLET | ORAL | Status: DC | PRN
  Administered 2022-12-26: 650 mg via ORAL
  Filled 2022-12-25: qty 2

## 2022-12-25 MED ORDER — ONDANSETRON HCL 4 MG/2ML IJ SOLN: 4.0000 mg | Freq: Four times a day (QID) | INTRAMUSCULAR | Status: DC | PRN

## 2022-12-25 MED ORDER — DIBUCAINE (PERIANAL) 1 % EX OINT: 1.0000 | TOPICAL_OINTMENT | CUTANEOUS | Status: DC | PRN

## 2022-12-25 MED ORDER — FENTANYL-BUPIVACAINE-NACL 0.5-0.125-0.9 MG/250ML-% EP SOLN
12.0000 mL/h | EPIDURAL | Status: DC | PRN
  Administered 2022-12-25: 12 mL/h via EPIDURAL
  Filled 2022-12-25: qty 250

## 2022-12-25 MED ORDER — LIDOCAINE HCL (PF) 1 % IJ SOLN: 30.0000 mL | INTRAMUSCULAR | Status: DC | PRN

## 2022-12-25 MED ORDER — OXYCODONE-ACETAMINOPHEN 5-325 MG PO TABS: 2.0000 | ORAL_TABLET | ORAL | Status: DC | PRN

## 2022-12-25 MED ORDER — ONDANSETRON HCL 4 MG/2ML IJ SOLN: 4.0000 mg | INTRAMUSCULAR | Status: DC | PRN

## 2022-12-25 MED ORDER — EPHEDRINE 5 MG/ML INJ: 10.0000 mg | INTRAVENOUS | Status: DC | PRN

## 2022-12-25 MED ORDER — DIPHENHYDRAMINE HCL 50 MG/ML IJ SOLN: 12.5000 mg | INTRAMUSCULAR | Status: DC | PRN

## 2022-12-25 MED ORDER — OXYTOCIN-SODIUM CHLORIDE 30-0.9 UT/500ML-% IV SOLN
1.0000 m[IU]/min | INTRAVENOUS | Status: DC
  Administered 2022-12-25: 2 m[IU]/min via INTRAVENOUS

## 2022-12-25 MED ORDER — TETANUS-DIPHTH-ACELL PERTUSSIS 5-2.5-18.5 LF-MCG/0.5 IM SUSY: 0.5000 mL | PREFILLED_SYRINGE | Freq: Once | INTRAMUSCULAR | Status: DC

## 2022-12-25 MED ORDER — LACTATED RINGERS IV SOLN: INTRAVENOUS | Status: DC

## 2022-12-25 MED ORDER — LACTATED RINGERS IV SOLN
500.0000 mL | INTRAVENOUS | Status: DC | PRN
  Administered 2022-12-25: 500 mL via INTRAVENOUS

## 2022-12-25 MED ORDER — ONDANSETRON HCL 4 MG PO TABS: 4.0000 mg | ORAL_TABLET | ORAL | Status: DC | PRN

## 2022-12-25 MED ORDER — LACTATED RINGERS IV SOLN
500.0000 mL | Freq: Once | INTRAVENOUS | Status: AC
  Administered 2022-12-25: 500 mL via INTRAVENOUS

## 2022-12-25 MED ORDER — BENZOCAINE-MENTHOL 20-0.5 % EX AERO
1.0000 | INHALATION_SPRAY | CUTANEOUS | Status: DC | PRN
  Filled 2022-12-25: qty 56

## 2022-12-25 MED ORDER — MORPHINE SULFATE (PF) 4 MG/ML IV SOLN
4.0000 mg | Freq: Once | INTRAVENOUS | Status: AC
  Administered 2022-12-25: 4 mg via INTRAMUSCULAR
  Filled 2022-12-25: qty 1

## 2022-12-25 MED ORDER — SOD CITRATE-CITRIC ACID 500-334 MG/5ML PO SOLN: 30.0000 mL | ORAL | Status: DC | PRN

## 2022-12-25 MED ORDER — PRENATAL MULTIVITAMIN CH
1.0000 | ORAL_TABLET | Freq: Every day | ORAL | Status: DC
  Administered 2022-12-26 – 2022-12-27 (×2): 1 via ORAL
  Filled 2022-12-25 (×2): qty 1

## 2022-12-25 NOTE — Progress Notes (Signed)
Labor Progress Note Victoria Barron is a 32 y.o. G1P0 at [redacted]w[redacted]d presented for SOL.   S: No acute concerns. Comfortable with the epidural.  O:  BP (!) 110/41   Pulse (!) 144   Temp 98.5 F (36.9 C) (Oral)   Resp 16   Ht  (1.575 m)   Wt 93.1 kg   LMP 03/29/2022   SpO2 99%   BMI 37.54 kg/m  EFM: 125bpm/moderate/+accels, no decels  CVE: Dilation: Lip/rim Effacement (%): 100 Cervical Position: Middle Station: 0 Presentation: Vertex Exam by:: Victoria Dade RN  A&P: 32 y.o. G1P0 [redacted]w[redacted]d here for SOL.  #Labor: Progressing well. Declined AROM at this time. Will reassess in 2 hours given positive fetal status.  #Pain: Epidural #FWB: Cat I  #GBS negative  #LGA Avoid use of vacuum if possible.   Victoria Rundquist Autry-Lott, DO 2:20 PM

## 2022-12-25 NOTE — MAU Note (Signed)
.  Victoria Barron is a 32 y.o. at [redacted]w[redacted]d here in MAU reporting: ctx that started last night and are now every 3-5 minutes apart. Patient also reports a gush of clear watery fluid at 0400. Denies VB. +FM.  Pain score: 10 Vitals:   12/25/22 0913  BP: 119/69  Pulse: 78  Resp: 14      Lab orders placed from triage:  fern

## 2022-12-25 NOTE — Anesthesia Preprocedure Evaluation (Signed)
Anesthesia Evaluation  Patient identified by MRN, date of birth, ID band Patient awake    Reviewed: Allergy & Precautions, NPO status , Patient's Chart, lab work & pertinent test results  History of Anesthesia Complications Negative for: history of anesthetic complications  Airway Mallampati: III  TM Distance: >3 FB Neck ROM: Full    Dental   Pulmonary former smoker   Pulmonary exam normal breath sounds clear to auscultation       Cardiovascular negative cardio ROS  Rhythm:Regular Rate:Normal     Neuro/Psych  Headaches    GI/Hepatic negative GI ROS, Neg liver ROS,,,  Endo/Other  negative endocrine ROS    Renal/GU negative Renal ROS     Musculoskeletal   Abdominal   Peds  Hematology negative hematology ROS (+)   Anesthesia Other Findings   Reproductive/Obstetrics (+) Pregnancy                             Anesthesia Physical Anesthesia Plan  ASA: 2  Anesthesia Plan: Epidural   Post-op Pain Management:    Induction:   PONV Risk Score and Plan:   Airway Management Planned:   Additional Equipment:   Intra-op Plan:   Post-operative Plan:   Informed Consent: I have reviewed the patients History and Physical, chart, labs and discussed the procedure including the risks, benefits and alternatives for the proposed anesthesia with the patient or authorized representative who has indicated his/her understanding and acceptance.       Plan Discussed with: Anesthesiologist  Anesthesia Plan Comments: (I have discussed risks of neuraxial anesthesia including but not limited to infection, bleeding, nerve injury, back pain, headache, seizures, and failure of block. Patient denies bleeding disorders and is not currently anticoagulated. Labs have been reviewed. Risks and benefits discussed. All patient's questions answered.  )       Anesthesia Quick Evaluation

## 2022-12-25 NOTE — Anesthesia Procedure Notes (Signed)
Epidural Patient location during procedure: OB Start time: 12/25/2022 11:49 AM End time: 12/25/2022 11:54 AM  Staffing Anesthesiologist: Linton Rump, MD Performed: anesthesiologist   Preanesthetic Checklist Completed: patient identified, IV checked, site marked, risks and benefits discussed, surgical consent, monitors and equipment checked, pre-op evaluation and timeout performed  Epidural Patient position: sitting Prep: DuraPrep and site prepped and draped Patient monitoring: continuous pulse ox and blood pressure Approach: midline Location: L3-L4 Injection technique: LOR saline  Needle:  Needle type: Tuohy  Needle gauge: 17 G Needle length: 9 cm and 9 Needle insertion depth: 5 cm Catheter type: closed end flexible Catheter size: 19 Gauge Catheter at skin depth: 9 cm Test dose: negative  Assessment Events: blood not aspirated, no cerebrospinal fluid, injection not painful, no injection resistance, no paresthesia and negative IV test  Additional Notes The patient has requested an epidural for labor pain management. Risks and benefits including, but not limited to, infection, bleeding, local anesthetic toxicity, headache, hypotension, back pain, block failure, etc. were discussed with the patient. The patient expressed understanding and consented to the procedure. I confirmed that the patient has no bleeding disorders and is not taking blood thinners. I confirmed the patient's last platelet count with the nurse. A time-out was performed immediately prior to the procedure. Please see nursing documentation for vital signs. Sterile technique was used throughout the whole procedure. Once LOR achieved, the epidural catheter threaded easily without resistance. Aspiration of the catheter was negative for blood and CSF. The epidural was dosed slowly and an infusion was started.  1 attempt(s)Reason for block:procedure for pain

## 2022-12-25 NOTE — H&P (Signed)
OBSTETRIC ADMISSION HISTORY AND PHYSICAL  Victoria Barron is a 32 y.o. female G1P0 with IUP at [redacted]w[redacted]d by LMP presenting for SOL. She reports +FMs, No LOF, no VB, no blurry vision, headaches or peripheral edema, and RUQ pain.  She plans on breast feeding. She request nothing for birth control. She received her prenatal care at  [facility]    Dating: By LMP --->  Estimated Date of Delivery: 01/03/23  Sono:    , CWD, normal anatomy, cephalic presentation, posterior fundal placental lie, 3570g, 87% EFW   Prenatal History/Complications:  -LGA  Past Medical History: Past Medical History:  Diagnosis Date   Headache     Past Surgical History: No past surgical history on file.  Obstetrical History: OB History     Gravida  1   Para      Term      Preterm      AB      Living         SAB      IAB      Ectopic      Multiple      Live Births              Social History Social History   Socioeconomic History   Marital status: Single    Spouse name: Not on file   Number of children: Not on file   Years of education: Not on file   Highest education level: Not on file  Occupational History   Not on file  Tobacco Use   Smoking status: Former    Types: Cigars    Quit date: 05/03/2022    Years since quitting: 0.6   Smokeless tobacco: Not on file  Vaping Use   Vaping Use: Never used  Substance and Sexual Activity   Alcohol use: Not Currently   Drug use: Not Currently    Types: Marijuana    Comment: last use Aug 2023   Sexual activity: Yes    Birth control/protection: None  Other Topics Concern   Not on file  Social History Narrative   Not on file   Social Determinants of Health   Financial Resource Strain: Not on file  Food Insecurity: Food Insecurity Present (07/22/2022)   Hunger Vital Sign    Worried About Running Out of Food in the Last Year: Sometimes true    Ran Out of Food in the Last Year: Sometimes true  Transportation Needs: No  Transportation Needs (07/22/2022)   PRAPARE - Administrator, Civil Service (Medical): No    Lack of Transportation (Non-Medical): No  Physical Activity: Not on file  Stress: Not on file  Social Connections: Not on file    Family History: Family History  Problem Relation Age of Onset   Asthma Mother    Diabetes Sister    Asthma Brother    Cancer Maternal Uncle    Diabetes Paternal Grandmother    Diabetes Paternal Grandfather    Heart disease Neg Hx    Hypertension Neg Hx    Stroke Neg Hx     Allergies: No Known Allergies  Medications Prior to Admission  Medication Sig Dispense Refill Last Dose   cyclobenzaprine (FLEXERIL) 5 MG tablet Take 1 tablet (5 mg total) by mouth 3 (three) times daily as needed for muscle spasms. 20 tablet 0    polyethylene glycol powder (GLYCOLAX/MIRALAX) 17 GM/SCOOP powder Take 17 g by mouth daily as needed. (Patient not taking: Reported on 12/15/2022) 510 g  5    Prenatal Vit-Fe Fumarate-FA (MULTIVITAMIN-PRENATAL) 27-0.8 MG TABS tablet Take 1 tablet by mouth daily at 12 noon.        Review of Systems   All systems reviewed and negative except as stated in HPI  Blood pressure (!) 112/58, pulse 83, resp. rate 16, last menstrual period 03/29/2022, SpO2 99 %. General appearance: alert and distress with contractions.  Lungs: normal effort Heart: regular rate noted Abdomen: gravid Extremities: LE edema Presentation: cephalic Fetal monitoringBaseline: 135 bpm, Variability: Good {> 6 bpm), Accelerations: Reactive, and Decelerations: Absent Uterine activity every 4 mins Dilation: 5 Effacement (%): 90 Station: -1 Exam by:: [employer] RN   Prenatal labs: ABO, Rh: --/--/A POS (04/18 1038) Antibody: NEG (04/18 1038) Rubella: 13.90 (11/14 1643) RPR: Non Reactive (02/07 0937)  HBsAg: Negative (11/14 1643)  HIV: Non Reactive (02/07 0937)  GBS: Negative/-- (04/08 1548)  1 hr Glucola 132 Genetic screening  wnl Anatomy US limited, f/u  wnl  Prenatal Transfer Tool  Maternal Diabetes: No Genetic Screening: Normal Maternal Ultrasounds/Referrals: Normal, LGA Fetal Ultrasounds or other Referrals:  None Maternal Substance Abuse:  No Significant Maternal Medications:  None Significant Maternal Lab Results:  None Number of Prenatal Visits:greater than 3 verified prenatal visits Other Comments:  None  Results for orders placed or performed during the hospital encounter of 12/25/22 (from the past 24 hour(s))  Type and screen [facility]   Collection Time: 12/25/22 10:38 AM  Result Value Ref Range   ABO/RH(D) A POS    Antibody Screen NEG    Sample Expiration      12/28/2022,2359 Performed at [facility], [address] [place], [place] [address]   CBC   Collection Time: 12/25/22 10:40 AM  Result Value Ref Range   WBC 18.8 (H) 4.0 - 10.5 K/uL   RBC 4.53 3.87 - 5.11 MIL/uL   Hemoglobin 12.4 12.0 - 15.0 g/dL   HCT 95.2 84.1 - 32.4 %   MCV 85.2 80.0 - 100.0 fL   MCH 27.4 26.0 - 34.0 pg   MCHC 32.1 30.0 - 36.0 g/dL   RDW 40.1 02.7 - 25.3 %   Platelets 290 150 - 400 K/uL   nRBC 0.0 0.0 - 0.2 %    Patient Active Problem List   Diagnosis Date Noted   Normal labor and delivery 12/25/2022   LGA (large for gestational age) fetus affecting management of mother 12/02/2022   Supervision of other normal pregnancy, antepartum 07/08/2022    Assessment/Plan:  Victoria Barron is a 32 y.o. G1P0 at [redacted]w[redacted]d here for SOL.   #Labor: Expectant management with possible AROM. The patient desires to wait until she is comfortable with her epidural #Pain: Planning for epidural #FWB: Cat I  #ID: GBS neg #MOF: Breast #MOC: None #Circ:  N/a, girl  Masayoshi Couzens Autry-Lott, DO  12/25/2022, 12:22 PM

## 2022-12-25 NOTE — MAU Provider Note (Addendum)
  S: Ms. Dashanti Burr is a 32 y.o. G1P0 at [redacted]w[redacted]d  who presents to MAU today complaining contractions q 4 minutes since yesterday. She denies vaginal bleeding. She denies LOF. She reports normal fetal movement.    O: BP 118/65   Pulse 84   Temp 98 F (36.7 C) (Oral)   Resp 18   Ht  (1.575 m)   Wt 93.1 kg   LMP 03/29/2022   SpO2 100%   BMI 37.55 kg/m   Cervical exam:  Dilation: 3 Effacement (%): 80 Cervical Position: Middle Station: -3 Presentation: Vertex Exam by:: Anastasio Champion, RN   Fetal Monitoring: Baseline: 140 Variability: moderate Accelerations: present Decelerations: absent Contractions: q4-6 minutes    A: SIUP at [redacted]w[redacted]d  False labor No significant cervical change despite appropriate observation in MAU. Responded to one time morphine IM dosing. Patient has a driver and feels comfortable being discharged.    P: Discharged home with labor precautions  Gilles Chiquito, MD PGY-3 __   GME ATTESTATION:  Evaluation and management procedures were performed by the [facility] Medicine Resident under my supervision. I was immediately available for direct supervision, assistance and direction throughout this encounter.  I also confirm that I have verified the information documented in the resident's note, and that I have also personally reperformed the pertinent components of the physical exam and all of the medical decision making activities.  I have also made any necessary editorial changes.  Myrtie Hawk, DO OB Fellow, Faculty [facility], Center for [facility] 12/25/2022 1:52 AM

## 2022-12-25 NOTE — Discharge Summary (Signed)
Postpartum Discharge Summary     Patient Name: Victoria Barron DOB: [birth year] MRN: [id number]  Date of admission: 12/25/2022 Delivery date:12/25/2022  Delivering provider: Lavonda Jumbo  Date of discharge: 12/27/2022  Admitting diagnosis: Normal labor and delivery [O80] Intrauterine pregnancy: [redacted]w[redacted]d     Secondary diagnosis:  Principal Problem:   Vaginal delivery Active Problems:   Supervision of other normal pregnancy, antepartum   LGA (large for gestational age) fetus affecting management of mother  Additional problems: None    Discharge diagnosis: Term Pregnancy Delivered                                              Post partum procedures: NA Augmentation: AROM Complications: Tight nuchal x2  Hospital course: Onset of Labor With Vaginal Delivery      32 y.o. yo G1P0 at [redacted]w[redacted]d was admitted in Latent Labor on 12/25/2022. Labor course was uncomplicated  Membrane Rupture Time/Date: 4:15 PM ,12/25/2022   Delivery Method:Vaginal, Spontaneous  Episiotomy: None  Lacerations:  2nd degree;Perineal  Patient had a uncomplicated postpartum course.  She is ambulating, tolerating a regular diet, passing flatus, and urinating well. Patient is discharged home in stable condition on 12/27/22.  Newborn Data: Birth date:12/25/2022  Birth time:8:05 PM  Gender:Female  Living status:Living  Apgars:9 ,9  Weight:3350 g   Magnesium Sulfate received: No BMZ received: No Rhophylac:N/A MMR:N/A T-DaP:Given postpartum Flu: N/A Transfusion:No  Physical exam  Vitals:   12/26/22 1120 12/26/22 1550 12/26/22 2101 12/27/22 0508  BP: (!) 106/59 108/62 114/61 (!) 99/57  Pulse: 85 91 72 74  Resp: Temp: 98.3 F (36.8 C) 98.4 F (36.9 C) 98.7 F (37.1 C) 98.4 F (36.9 C)  TempSrc: Oral Oral Oral Oral  SpO2:   100% 100%  Weight:      Height:       General: alert, cooperative, and no distress Lochia: appropriate Uterine Fundus: firm Incision: N/A DVT Evaluation: No evidence  of DVT seen on physical exam. Labs: Lab Results  Component Value Date   WBC 18.4 (H) 12/26/2022   HGB 10.4 (L) 12/26/2022   HCT 31.3 (L) 12/26/2022   MCV 84.6 12/26/2022   PLT 242 12/26/2022      Latest Ref Rng & Units 02/14/2015   12:16 AM  CMP  Glucose 65 - 99 mg/dL 96   BUN 6 - 20 mg/dL 11   Creatinine 4.09 - 1.00 mg/dL 8.11   Sodium 914 - 782 mmol/L 138   Potassium 3.5 - 5.1 mmol/L 3.3   Chloride 101 - 111 mmol/L 105   CO2 22 - 32 mmol/L 24   Calcium 8.9 - 10.3 mg/dL 8.8   Total Protein 6.5 - 8.1 g/dL 7.5   Total Bilirubin 0.3 - 1.2 mg/dL 0.2   Alkaline Phos 38 - 126 U/L 52   AST 15 - 41 U/L 16   ALT 14 - 54 U/L 11    Edinburgh Score:    12/26/2022   11:20 AM  Edinburgh Postnatal Depression Scale Screening Tool  I have been able to laugh and see the funny side of things. 0  I have looked forward with enjoyment to things. 0  I have blamed myself unnecessarily when things went wrong. 0  I have been anxious or worried for no good reason. 0  I have felt scared or panicky  for no good reason. 0  Things have been getting on top of me. 1  I have been so unhappy that I have had difficulty sleeping. 0  I have felt sad or miserable. 1  I have been so unhappy that I have been crying. 1  The thought of harming myself has occurred to me. 0  Edinburgh Postnatal Depression Scale Total 3     After visit meds:  Allergies as of 12/27/2022   No Known Allergies      Medication List     TAKE these medications    acetaminophen 325 MG tablet Commonly known as: Tylenol Take 2 tablets (650 mg total) by mouth every 4 (four) hours as needed (for pain scale < 4).   cyclobenzaprine 5 MG tablet Commonly known as: FLEXERIL Take 1 tablet (5 mg total) by mouth 3 (three) times daily as needed for muscle spasms.   ferrous sulfate 325 (65 FE) MG tablet Take 1 tablet (325 mg total) by mouth every other day.   ibuprofen 600 MG tablet Commonly known as: ADVIL Take 1 tablet (600 mg  total) by mouth every 6 (six) hours.   multivitamin-prenatal 27-0.8 MG Tabs tablet Take 1 tablet by mouth daily at 12 noon.   norethindrone 0.35 MG tablet Commonly known as: Ortho Micronor Take 1 tablet (0.35 mg total) by mouth daily.   polyethylene glycol powder 17 GM/SCOOP powder Commonly known as: GLYCOLAX/MIRALAX Take 17 g by mouth daily as needed.         Discharge home in stable condition Infant Feeding: Breast Infant Disposition:home with mother Discharge instruction: per After Visit Summary and Postpartum booklet. Activity: Advance as tolerated. Pelvic rest for 6 weeks.  Diet: routine diet Future Appointments: Future Appointments  Date Time Provider Department Center  01/27/2023  2:35 PM Venora Maples, MD [facility]   Follow up Visit:  Message sent to [facility] by Autry-Lott on 12/27/2022  Please schedule this patient for a In person postpartum visit in 6 weeks with the following provider: Any provider. Additional Postpartum F/U: n/a   Low risk pregnancy complicated by:  LGA Delivery mode:  Vaginal, Spontaneous  Anticipated Birth Control:  POPs   12/27/2022 Celedonio Savage, MD

## 2022-12-25 NOTE — Progress Notes (Addendum)
Labor Progress Note Victoria Barron is a 32 y.o. G1P0 at [redacted]w[redacted]d presented for SOL.   S: No acute concerns. Comfortable with the epidural.  O:  BP (!) 91/54   Pulse 90   Temp 98.1 F (36.7 C) (Oral)   Resp 18   Ht  (1.575 m)   Wt 93.1 kg   LMP 03/29/2022   SpO2 99%   BMI 37.54 kg/m  EFM: 140bpm/moderate/+accels, no decels  CVE: Dilation: Lip/rim Effacement (%): 100 Cervical Position: Middle Station: 0, Plus 1 Presentation: Vertex Exam by:: Dr. Salvadore Dom  A&P: 32 y.o. G1P0 [redacted]w[redacted]d here for SOL.  #Labor: Progressing well. S/p AROM and in thrown position. Contractions now every 10 mins. Will start pitocin 2by2.  #Pain: Epidural #FWB: Cat I  #GBS negative  #LGA Avoid use of vacuum if possible.   Victoria Wamble Autry-Lott, DO 5:58 PM

## 2022-12-26 LAB — CBC
HCT: 31.3 % — ABNORMAL LOW (ref 36.0–46.0)
Hemoglobin: 10.4 g/dL — ABNORMAL LOW (ref 12.0–15.0)
MCH: 28.1 pg (ref 26.0–34.0)
MCHC: 33.2 g/dL (ref 30.0–36.0)
MCV: 84.6 fL (ref 80.0–100.0)
Platelets: 242 10*3/uL (ref 150–400)
RBC: 3.7 MIL/uL — ABNORMAL LOW (ref 3.87–5.11)
RDW: 14.6 % (ref 11.5–15.5)
WBC: 18.4 10*3/uL — ABNORMAL HIGH (ref 4.0–10.5)
nRBC: 0 % (ref 0.0–0.2)

## 2022-12-26 LAB — RPR: RPR Ser Ql: NONREACTIVE

## 2022-12-26 MED ORDER — FERROUS SULFATE 325 (65 FE) MG PO TABS
325.0000 mg | ORAL_TABLET | Freq: Every day | ORAL | Status: DC
  Administered 2022-12-26 – 2022-12-27 (×2): 325 mg via ORAL
  Filled 2022-12-26 (×2): qty 1

## 2022-12-26 NOTE — Lactation Note (Signed)
This note was copied from a baby's chart. Lactation Consultation Note  Patient Name: Victoria Barron WGNFA'O Date: 12/26/2022 Age:32 hours Reason for consult: Follow-up assessment;Early term 37-38.6wks;Primapara;1st time breastfeeding;Infant weight loss (LC reviewed the doc flow sheets with mom .) Baby asleep at present, mom aware to feed with feeding cues and by 3 hours STS.  Mom asked LC to check her hand free DEBP . LC unfamiliar with the make and model. Suggested to google the brand and obtain the instruction. LC did provide a hand pump with instructions and check the flange size the #21 fit the best. Due to areola edema reviewed hand expressing and reverse pressure exercise.  Per mom it feeling good about the latch.   Maternal Data Has patient been taught Hand Expression?: Yes  Feeding Mother's Current Feeding Choice: Breast Milk and Formula  LATCH Score   Lactation Tools Discussed/Used Tools: Pump;Flanges Flange Size: 21;24;Other (comment) (#21 the better fit) Breast pump type: Manual Pump Education: Setup, frequency, and cleaning  Interventions Interventions: Hand pump;Education  Discharge Pump: Manual;Hands Free;DEBP;Personal  Consult Status Consult Status: Follow-up Date: 12/27/22 Follow-up type: In-patient    Matilde Sprang Tomicka Lover 12/26/2022, 2:54 PM

## 2022-12-26 NOTE — Lactation Note (Signed)
This note was copied from a baby's chart. Lactation Consultation Note  Patient Name: Victoria Barron Today's Date: 12/26/2022 Age:32 hours Reason for consult: Initial assessment;1st time breastfeeding;Early term 37-38.6wks  P1, ETI female infant. Birth Parent latched infant on her right breast using the football hold position, infant BF for 9 minutes then became sleepy. Birth Parent was doing skin to skin with infant when [facility] left the room. Birth Parent will continue to work towards latching infant at the breast, breastfeeding by cues, on demand, 8 to 12+ times within 24 hours, skin to skin. Birth Parent knows to ask RN/LC for further latch assistance if needed.LC discussed infant's input and output. LC used breast model to teach hand expression. LC discussed the importance of maternal rest, diet and hydration. Birth Parent was made aware of O/P services, breastfeeding support groups, community resources, and our phone # for post-discharge questions.     Maternal Data Has patient been taught Hand Expression?: Yes Does the patient have breastfeeding experience prior to this delivery?: No  Feeding Mother's Current Feeding Choice: Breast Milk and Formula  LATCH Score Latch: Grasps breast easily, tongue down, lips flanged, rhythmical sucking.  Audible Swallowing: A few with stimulation  Type of Nipple: Everted at rest and after stimulation  Comfort (Breast/Nipple): Soft / non-tender  Hold (Positioning): Assistance needed to correctly position infant at breast and maintain latch.  LATCH Score: 8   Lactation Tools Discussed/Used    Interventions Interventions: Support pillows;Adjust position;Position options;Skin to skin;Education;Assisted with latch;Breast feeding basics reviewed;Breast compression;Hand express;LC Services brochure  Discharge Pump: DEBP;Personal (Per Birth Parent, she has 3 different types of DEBP's at home.)  Consult Status Consult Status: Follow-up Date:  12/26/22 Follow-up type: In-patient    Frederico Hamman 12/26/2022, 12:48 AM

## 2022-12-26 NOTE — Progress Notes (Addendum)
POSTPARTUM PROGRESS NOTE  Post Partum Day 1  Subjective:  Victoria Barron is a 32 y.o. G1P1001 s/p SVD at [redacted]w[redacted]d.  She reports she is doing well. No acute events overnight. She denies any problems with ambulating, voiding or po intake. No BM yet but is passing flatus. Denies nausea or vomiting. Pain is moderately controlled. Lochia is adequate. No dizziness, chest pain, shortness of breath.   Objective: Blood pressure (!) 98/58, pulse 97, temperature 98.1 F (36.7 C), temperature source Oral, resp. rate 17, height  (1.575 m), weight 93.1 kg, last menstrual period 03/29/2022, SpO2 100 %, unknown if currently breastfeeding.  Physical Exam:  General: alert, cooperative and no distress Chest: no respiratory distress Heart:regular rate, distal pulses intact, cap refill <2sec Abdomen: soft, nontender,  Uterine Fundus: firm, appropriately tender DVT Evaluation: No calf swelling or tenderness, Homan's negative Extremities: No edema Skin: warm, dry  Recent Labs    12/25/22 1040 12/26/22 0552  HGB 12.4 10.4*  HCT 38.6 31.3*    Assessment/Plan: Victoria Barron is a 32 y.o. G1P1001 s/p SVD at [redacted]w[redacted]d.   PPD#1 - Doing well  Routine postpartum care  Mild hypotension - Noted this morning, hovering around 98/56. Soft on admission to 110s/60s.  Only mild blood loss during birth, only mild bleeding since.   Asymptomatic this AM  CTM, likely will improve with fluid shifts following birth.  Anemia-start PO iron Contraception: Oral POPs Feeding: Breast Dispo: Plan for discharge tomorrow or Saturday.   LOS: 1 day   Rosario Adie, MS3 [facility]  12/26/22 8:28 AM   I was personally present and re-performed the exam and MDM and verified the service and findings are accurately documented in the student's note. Jeanise Durfey Autry-Lott, DO 12/26/22 9:34 AM   Lavonda Jumbo, DO OB Fellow, Faculty [facility], Center for [facility] 12/26/2022, 9:31  AM

## 2022-12-26 NOTE — Anesthesia Postprocedure Evaluation (Signed)
Anesthesia Post Note  Patient: Victoria Barron  Procedure(s) Performed: AN AD HOC LABOR EPIDURAL     Patient location during evaluation: Mother Baby Anesthesia Type: Epidural Level of consciousness: awake and alert and oriented Pain management: satisfactory to patient Vital Signs Assessment: post-procedure vital signs reviewed and stable Respiratory status: respiratory function stable Cardiovascular status: stable Postop Assessment: no headache, no backache, epidural receding, patient able to bend at knees, no signs of nausea or vomiting, adequate PO intake and able to ambulate Anesthetic complications: no   No notable events documented.  Last Vitals:  Vitals:   12/26/22 0416 12/26/22 0740  BP: (!) 98/56 (!) 98/58  Pulse: 79 97  Resp: 18 17  Temp: 36.7 C 36.7 C  SpO2: 99% 100%    Last Pain:  Vitals:   12/26/22 0740  TempSrc: Oral  PainSc:    Pain Goal: Patients Stated Pain Goal: 0 (12/25/22 1051)                 [name]

## 2022-12-27 ENCOUNTER — Encounter (HOSPITAL_COMMUNITY): Payer: Self-pay | Admitting: Family Medicine

## 2022-12-27 MED ORDER — ACETAMINOPHEN 325 MG PO TABS: 650.0000 mg | ORAL_TABLET | ORAL | 0 refills | Status: DC | PRN

## 2022-12-27 MED ORDER — FERROUS SULFATE 325 (65 FE) MG PO TABS: 325.0000 mg | ORAL_TABLET | ORAL | 3 refills | Status: DC

## 2022-12-27 MED ORDER — IBUPROFEN 600 MG PO TABS: 600.0000 mg | ORAL_TABLET | Freq: Four times a day (QID) | ORAL | 0 refills | Status: DC

## 2022-12-27 MED ORDER — NORETHINDRONE 0.35 MG PO TABS: 1.0000 | ORAL_TABLET | Freq: Every day | ORAL | 11 refills | Status: DC

## 2022-12-27 NOTE — Lactation Note (Addendum)
This note was copied from a baby's chart. Lactation Consultation Note  Patient Name: Girl Ginamarie Banfield [id number] Date: 12/27/2022 Age:32 hours Reason for consult: Follow-up assessment;Early term 37-38.6wks   Maternal Data    Upon entering the room birthing parent was breastfeeding infant in the cross cradle position on the left breast for about 5 minutes per birthing parent. Per birthing parent they are about to d/c home but infant were showing feeding cues. Audible sucks and swallows were noted and correct position. MOB concerned with how to know when infant in getting enough and correct positioning for football hold position.Reviewed infant output expectations and signs of milk transfer and fullness. Reviewed correct positioning and latch. Reviewed signs of engorgement and mastitis.   Feeding Mother's Current Feeding Choice: Breast Milk and Formula  LATCH Score Latch: Grasps breast easily, tongue down, lips flanged, rhythmical sucking.  Audible Swallowing: Spontaneous and intermittent  Type of Nipple: Everted at rest and after stimulation  Comfort (Breast/Nipple): Soft / non-tender  Hold (Positioning): No assistance needed to correctly position infant at breast.  LATCH Score: 10   Lactation Tools Discussed/Used    Interventions Interventions: Breast feeding basics reviewed;Skin to skin;Breast massage;Hand express  Discharge Discharge Education: Engorgement and breast care;Warning signs for feeding baby;Outpatient recommendation  Consult Status Consult Status: Follow-up Follow-up type: In-patient    Jon Gills 12/27/2022, 2:20 PM

## 2022-12-30 ENCOUNTER — Encounter: Payer: 59 | Admitting: Family Medicine

## 2023-01-05 ENCOUNTER — Encounter: Payer: 59 | Admitting: Family Medicine

## 2023-01-05 ENCOUNTER — Telehealth (HOSPITAL_COMMUNITY): Payer: Self-pay | Admitting: *Deleted

## 2023-01-05 ENCOUNTER — Other Ambulatory Visit: Payer: 59

## 2023-01-05 NOTE — Telephone Encounter (Signed)
Patient voiced no questions or concerns regarding her health at this time. EPDS=9. Patient voiced no questions or concerns regarding infant at this time. RN reviewed ABCs of safe sleep. Patient verbalized understanding. Patient requested RN email information on hospital's postpartum classes and support groups. Patient also requested RN send list of maternal mental health resources. Email sent. Deforest Hoyles, RN, 01/05/23, 2002

## 2023-01-06 ENCOUNTER — Encounter: Payer: Self-pay | Admitting: Family Medicine

## 2023-01-06 MED ORDER — IBUPROFEN 600 MG PO TABS: 600.0000 mg | ORAL_TABLET | Freq: Four times a day (QID) | ORAL | 3 refills | Status: AC

## 2023-01-27 ENCOUNTER — Ambulatory Visit (INDEPENDENT_AMBULATORY_CARE_PROVIDER_SITE_OTHER): Payer: 59 | Admitting: Family Medicine

## 2023-01-27 ENCOUNTER — Encounter: Payer: Self-pay | Admitting: Family Medicine

## 2023-01-27 ENCOUNTER — Other Ambulatory Visit: Payer: Self-pay

## 2023-01-27 DIAGNOSIS — N760 Acute vaginitis: Secondary | ICD-10-CM | POA: Diagnosis not present

## 2023-01-27 DIAGNOSIS — Z975 Presence of (intrauterine) contraceptive device: Secondary | ICD-10-CM

## 2023-01-27 DIAGNOSIS — B9689 Other specified bacterial agents as the cause of diseases classified elsewhere: Secondary | ICD-10-CM | POA: Diagnosis not present

## 2023-01-27 DIAGNOSIS — Z30017 Encounter for initial prescription of implantable subdermal contraceptive: Secondary | ICD-10-CM

## 2023-01-27 DIAGNOSIS — Z0289 Encounter for other administrative examinations: Secondary | ICD-10-CM

## 2023-01-27 MED ORDER — METRONIDAZOLE 500 MG PO TABS: 500.0000 mg | ORAL_TABLET | Freq: Two times a day (BID) | ORAL | 0 refills | Status: AC

## 2023-01-27 MED ORDER — ETONOGESTREL 68 MG ~~LOC~~ IMPL
68.0000 mg | DRUG_IMPLANT | Freq: Once | SUBCUTANEOUS | Status: AC
  Administered 2023-01-27: 68 mg via SUBCUTANEOUS

## 2023-01-27 NOTE — Progress Notes (Signed)
     GYNECOLOGY OFFICE PROCEDURE NOTE  Victoria Barron is a 32 y.o. G1P1001 here for Nexplanon insertion.    Nexplanon insertion Procedure Patient identified, informed consent performed, consent signed.   Patient does understand that irregular bleeding is a very common side effect of this medication. She was advised to have backup contraception for one week after placement. Pregnancy test in clinic today was negative.  Appropriate time out taken.  Patient's left arm was prepped and draped in the usual sterile fashion. The ruler used to measure and mark insertion area.  Patient was prepped with alcohol swab and then injected with 3 ml of 1% lidocaine.  She was prepped with betadine, Nexplanon removed from packaging,  Device confirmed in needle, then inserted full length of needle and withdrawn per handbook instructions. Nexplanon was able to palpated in the patient's arm; patient palpated the insert herself. There was minimal blood loss.  Patient insertion site covered with guaze and a pressure bandage to reduce any bruising.  The patient tolerated the procedure well and was given post procedure instructions.  Venora Maples, MD/MPH Family Medicine, [facility] for [employer], [facility]

## 2023-01-27 NOTE — Addendum Note (Signed)
Addended by: Isabell Jarvis on: 01/27/2023 05:39 PM   Modules accepted: Orders

## 2023-01-27 NOTE — Progress Notes (Signed)
Post Partum Visit Note  Victoria Barron is a 32 y.o. G17P1001 female who presents for a postpartum visit. She is 4 weeks postpartum following a normal spontaneous vaginal delivery.  I have fully reviewed the prenatal and intrapartum course. The delivery was at 38.5 gestational weeks.  Anesthesia: epidural. Postpartum course has been uneventful. Baby is doing well. Baby is feeding by breast. Bleeding staining only. Bowel function is normal. Bladder function is normal. Patient is not sexually active. Contraception method is oral progesterone-only contraceptive. Postpartum depression screening: negative.   The pregnancy intention screening data noted above was reviewed. Potential methods of contraception were discussed. The patient elected to proceed with No data recorded.   Edinburgh Postnatal Depression Scale - 01/27/23 1509       Edinburgh Postnatal Depression Scale:  In the Past 7 Days   I have been able to laugh and see the funny side of things. 0    I have looked forward with enjoyment to things. 0    I have blamed myself unnecessarily when things went wrong. 0    I have been anxious or worried for no good reason. 0    I have felt scared or panicky for no good reason. 0    Things have been getting on top of me. 0    I have been so unhappy that I have had difficulty sleeping. 0    I have felt sad or miserable. 0    I have been so unhappy that I have been crying. 0    The thought of harming myself has occurred to me. 0    Edinburgh Postnatal Depression Scale Total 0             There are no preventive care reminders to display for this patient.   The following portions of the patient's history were reviewed and updated as appropriate: allergies, current medications, past family history, past medical history, past social history, past surgical history, and problem list.  Review of Systems Pertinent items noted in HPI and remainder of comprehensive ROS otherwise  negative.  Objective:  BP 110/75   Pulse (!) 101   Ht 5\' 2"  (1.575 m)   Wt 186 lb (84.4 kg)   LMP  (LMP Unknown)   Breastfeeding Yes   BMI 34.02 kg/m    General:  alert, cooperative, and appears stated age   Breasts:  not indicated  Lungs: Comfortalbe on room air  Wound N/a  GU exam:  not indicated        Assessment:   Postpartum care and examination  BV (bacterial vaginosis)   Normal postpartum exam.   Plan:   Essential components of care per ACOG recommendations:  1.  Mood and well being: Patient with negative depression screening today. Reviewed local resources for support.  - Patient tobacco use? No.   - hx of drug use? No.    2. Infant care and feeding:  -Patient currently breastmilk feeding? Yes. Reviewed importance of draining breast regularly to support lactation.  -Social determinants of health (SDOH) reviewed in EPIC. No concerns  3. Sexuality, contraception and birth spacing - Patient does not want a pregnancy in the next year.  Desired family size is 1 children.  - Reviewed reproductive life planning. Reviewed contraceptive methods based on pt preferences and effectiveness.  Patient desired Hormonal Implant today.   - Discussed birth spacing of 18 months  4. Sleep and fatigue -Encouraged family/partner/community support of 4 hrs of uninterrupted sleep to  help with mood and fatigue  5. Physical Recovery  - Discussed patients delivery and complications. She describes her labor as good. - Patient had a Vaginal, no problems at delivery. Patient had a 2nd degree laceration. Perineal healing reviewed. Patient expressed understanding - Patient has urinary incontinence? No. - Patient is safe to resume physical and sexual activity  6.  Health Maintenance - HM due items addressed No - up to date - Last pap smear  Diagnosis  Date Value Ref Range Status  07/22/2022   Final   - Negative for intraepithelial lesion or malignancy (NILM)   Pap smear not done at  today's visit.  -Breast Cancer screening indicated? No.   7. Chronic Disease/Pregnancy Condition follow up: None  - PCP follow up   Venora Maples, MD/MPH Attending Family Medicine Physician, [facility] for [facility], [facility]

## 2023-12-18 ENCOUNTER — Encounter: Payer: Self-pay | Admitting: Family Medicine

## 2024-09-05 ENCOUNTER — Encounter: Payer: Self-pay | Admitting: Obstetrics and Gynecology

## 2024-09-05 ENCOUNTER — Ambulatory Visit (INDEPENDENT_AMBULATORY_CARE_PROVIDER_SITE_OTHER): Admitting: Obstetrics and Gynecology

## 2024-09-05 ENCOUNTER — Other Ambulatory Visit: Payer: Self-pay

## 2024-09-05 VITALS — BP 110/51 | HR 80 | Ht 62.0 in | Wt 211.9 lb

## 2024-09-05 DIAGNOSIS — Z3046 Encounter for surveillance of implantable subdermal contraceptive: Secondary | ICD-10-CM | POA: Diagnosis not present

## 2024-09-05 DIAGNOSIS — N921 Excessive and frequent menstruation with irregular cycle: Secondary | ICD-10-CM

## 2024-09-05 NOTE — Progress Notes (Signed)
" ° ° ° °  GYNECOLOGY OFFICE PROCEDURE NOTE  Harris Rommel is a 33 y.o. G1P1001 here for Nexplanon  removal Last pap smear was in 2023 and was normal.  No other gynecologic concerns.    Nexplanon  Removal Patient identified, informed consent performed, consent signed.   Appropriate time out taken. Nexplanon  site identified.  Area prepped in usual sterile fashon. One ml of 1% lidocaine  was used to anesthetize the area at the distal end of the implant. A small stab incision was made right beside the implant on the distal portion.  The Nexplanon  rod was grasped using hemostats and removed without difficulty.  There was minimal blood loss. There were no complications.  3 ml of 1% lidocaine  was injected around the incision for post-procedure analgesia.  Steri-strips were applied over the small incision.  A pressure bandage was applied to reduce any bruising.  The patient tolerated the procedure well and was given post procedure instructions.  Patient is planning to use abstinence for contraception.    Jerilynn Buddle, MD, FACOG Obstetrician & Gynecologist, [facility] for [facility], [facility]     "
